# Patient Record
Sex: Male | Born: 1960 | Race: Black or African American | Hispanic: No | Marital: Single | Smoking: Current some day smoker
Health system: Southern US, Community
[De-identification: ages and names within clinical notes are randomized; demographics above are authoritative.]

## PROBLEM LIST (undated history)

## (undated) DIAGNOSIS — K759 Inflammatory liver disease, unspecified: Secondary | ICD-10-CM

## (undated) DIAGNOSIS — F329 Major depressive disorder, single episode, unspecified: Secondary | ICD-10-CM

## (undated) DIAGNOSIS — F419 Anxiety disorder, unspecified: Secondary | ICD-10-CM

## (undated) DIAGNOSIS — J449 Chronic obstructive pulmonary disease, unspecified: Secondary | ICD-10-CM

## (undated) DIAGNOSIS — J45909 Unspecified asthma, uncomplicated: Secondary | ICD-10-CM

## (undated) DIAGNOSIS — R569 Unspecified convulsions: Secondary | ICD-10-CM

## (undated) DIAGNOSIS — M75101 Unspecified rotator cuff tear or rupture of right shoulder, not specified as traumatic: Secondary | ICD-10-CM

## (undated) DIAGNOSIS — F32A Depression, unspecified: Secondary | ICD-10-CM

---

## 2014-08-28 ENCOUNTER — Emergency Department (HOSPITAL_COMMUNITY)
Admission: EM | Admit: 2014-08-28 | Discharge: 2014-08-29 | Disposition: A | Discharge status: Home / Self Care | Payer: Medicaid Other | Attending: Emergency Medicine | Admitting: Emergency Medicine

## 2014-08-28 DIAGNOSIS — Y929 Unspecified place or not applicable: Secondary | ICD-10-CM | POA: Insufficient documentation

## 2014-08-28 DIAGNOSIS — W3400XA Accidental discharge from unspecified firearms or gun, initial encounter: Secondary | ICD-10-CM

## 2014-08-28 DIAGNOSIS — S21102A Unspecified open wound of left front wall of thorax without penetration into thoracic cavity, initial encounter: Secondary | ICD-10-CM | POA: Insufficient documentation

## 2014-08-28 DIAGNOSIS — S61401A Unspecified open wound of right hand, initial encounter: Secondary | ICD-10-CM | POA: Diagnosis not present

## 2014-08-28 DIAGNOSIS — T07XXXA Unspecified multiple injuries, initial encounter: Secondary | ICD-10-CM

## 2014-08-28 DIAGNOSIS — S31109A Unspecified open wound of abdominal wall, unspecified quadrant without penetration into peritoneal cavity, initial encounter: Secondary | ICD-10-CM | POA: Insufficient documentation

## 2014-08-28 DIAGNOSIS — S0183XA Puncture wound without foreign body of other part of head, initial encounter: Secondary | ICD-10-CM | POA: Insufficient documentation

## 2014-08-28 DIAGNOSIS — X58XXXA Exposure to other specified factors, initial encounter: Secondary | ICD-10-CM | POA: Diagnosis not present

## 2014-08-28 DIAGNOSIS — K047 Periapical abscess without sinus: Secondary | ICD-10-CM | POA: Insufficient documentation

## 2014-08-28 DIAGNOSIS — S0091XA Abrasion of unspecified part of head, initial encounter: Secondary | ICD-10-CM | POA: Diagnosis not present

## 2014-08-28 DIAGNOSIS — S61502A Unspecified open wound of left wrist, initial encounter: Secondary | ICD-10-CM | POA: Insufficient documentation

## 2014-08-28 DIAGNOSIS — Y9301 Activity, walking, marching and hiking: Secondary | ICD-10-CM | POA: Diagnosis not present

## 2014-08-28 DIAGNOSIS — S71102A Unspecified open wound, left thigh, initial encounter: Secondary | ICD-10-CM | POA: Diagnosis present

## 2014-08-28 DIAGNOSIS — S61402A Unspecified open wound of left hand, initial encounter: Secondary | ICD-10-CM | POA: Insufficient documentation

## 2014-08-28 DIAGNOSIS — R000 Tachycardia, unspecified: Secondary | ICD-10-CM | POA: Insufficient documentation

## 2014-08-28 DIAGNOSIS — Y998 Other external cause status: Secondary | ICD-10-CM | POA: Insufficient documentation

## 2014-08-28 HISTORY — DX: Unspecified asthma, uncomplicated: J45.909
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
## 2014-08-29 ENCOUNTER — Emergency Department (HOSPITAL_COMMUNITY): Payer: Medicaid Other

## 2014-08-29 ENCOUNTER — Encounter (HOSPITAL_COMMUNITY): Payer: Self-pay | Admitting: Radiology

## 2014-08-29 DIAGNOSIS — S71102A Unspecified open wound, left thigh, initial encounter: Secondary | ICD-10-CM | POA: Diagnosis not present

## 2014-08-29 LAB — TYPE AND SCREEN
ABO/RH(D): A POS
Antibody Screen: NEGATIVE
Unit division: 0
Unit division: 0

## 2014-08-29 LAB — CBC
HCT: 39.9 % (ref 39.0–52.0)
Hemoglobin: 14.3 g/dL (ref 13.0–17.0)
MCH: 31.6 pg (ref 26.0–34.0)
MCHC: 35.8 g/dL (ref 30.0–36.0)
MCV: 88.1 fL (ref 78.0–100.0)
PLATELETS: 166 10*3/uL (ref 150–400)
RBC: 4.53 MIL/uL (ref 4.22–5.81)
RDW: 14.5 % (ref 11.5–15.5)
WBC: 8.6 10*3/uL (ref 4.0–10.5)

## 2014-08-29 LAB — CDS SEROLOGY

## 2014-08-29 LAB — COMPREHENSIVE METABOLIC PANEL
ALT: 53 U/L (ref 0–53)
AST: 62 U/L — ABNORMAL HIGH (ref 0–37)
Albumin: 4.1 g/dL (ref 3.5–5.2)
Alkaline Phosphatase: 51 U/L (ref 39–117)
Anion gap: 12 (ref 5–15)
BUN: 12 mg/dL (ref 6–23)
CO2: 24 mmol/L (ref 19–32)
Calcium: 9.4 mg/dL (ref 8.4–10.5)
Chloride: 106 mmol/L (ref 96–112)
Creatinine, Ser: 1.36 mg/dL — ABNORMAL HIGH (ref 0.50–1.35)
GFR calc non Af Amer: 58 mL/min — ABNORMAL LOW (ref >90)
GFR, EST AFRICAN AMERICAN: 67 mL/min — AB (ref >90)
Glucose, Bld: 106 mg/dL — ABNORMAL HIGH (ref 70–99)
POTASSIUM: 4.3 mmol/L (ref 3.5–5.1)
Sodium: 142 mmol/L (ref 135–145)
TOTAL PROTEIN: 7.6 g/dL (ref 6.0–8.3)
Total Bilirubin: 0.6 mg/dL (ref 0.3–1.2)

## 2014-08-29 LAB — PREPARE FRESH FROZEN PLASMA
UNIT DIVISION: 0
Unit division: 0

## 2014-08-29 LAB — ABO/RH: ABO/RH(D): A POS

## 2014-08-29 LAB — ETHANOL: Alcohol, Ethyl (B): 59 mg/dL — ABNORMAL HIGH (ref 0–9)

## 2014-08-29 LAB — PROTIME-INR
INR: 1.09 (ref 0.00–1.49)
Prothrombin Time: 14.2 seconds (ref 11.6–15.2)

## 2014-08-29 MED ORDER — IOHEXOL 300 MG/ML  SOLN
100.0000 mL | Freq: Once | INTRAMUSCULAR | Status: AC | PRN
  Administered 2014-08-29: 100 mL via INTRAVENOUS

## 2014-08-29 MED ORDER — HYDROCODONE-ACETAMINOPHEN 5-325 MG PO TABS: 2.0000 | ORAL_TABLET | ORAL | Status: DC | PRN

## 2014-08-29 MED ORDER — CEFAZOLIN SODIUM 1-5 GM-% IV SOLN
1.0000 g | Freq: Once | INTRAVENOUS | Status: AC
  Administered 2014-08-29: 1 g via INTRAVENOUS
  Filled 2014-08-29: qty 50

## 2014-08-29 MED ORDER — CEPHALEXIN 250 MG PO CAPS: 250.0000 mg | ORAL_CAPSULE | Freq: Four times a day (QID) | ORAL | Status: DC

## 2014-08-29 NOTE — ED Notes (Signed)
[employer] at bedside 
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
## 2014-08-29 NOTE — ED Notes (Signed)
Pt remains in radiology 
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
## 2014-08-29 NOTE — ED Notes (Signed)
Reviewed discharge instructions and prescriptions  Voiced understanding

## 2014-08-29 NOTE — ED Notes (Signed)
Pt transported to CT and XR
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
## 2014-08-29 NOTE — ED Notes (Signed)
Patient reports he has consumed ETOH and marijuana this evening.
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
## 2014-09-23 ENCOUNTER — Other Ambulatory Visit: Payer: Medicaid Other

## 2014-09-23 DIAGNOSIS — B182 Chronic viral hepatitis C: Secondary | ICD-10-CM

## 2014-09-23 LAB — IRON: Iron: 57 ug/dL (ref 42–165)

## 2014-09-24 LAB — HEPATITIS B SURFACE ANTIGEN: HEP B S AG: NEGATIVE

## 2014-09-24 LAB — HIV ANTIBODY (ROUTINE TESTING W REFLEX): HIV 1&2 Ab, 4th Generation: NONREACTIVE

## 2014-09-24 LAB — HEPATITIS A ANTIBODY, TOTAL: Hep A Total Ab: REACTIVE — AB

## 2014-09-24 LAB — HEPATITIS B CORE ANTIBODY, TOTAL: HEP B C TOTAL AB: REACTIVE — AB

## 2014-09-24 LAB — HEPATITIS B SURFACE ANTIBODY,QUALITATIVE: Hep B S Ab: NEGATIVE

## 2014-09-26 LAB — ANA: ANA: NEGATIVE

## 2014-09-26 LAB — HEPATITIS C RNA QUANTITATIVE
HCV QUANT LOG: 6.64 {Log} — AB (ref <1.18)
HCV Quantitative: 4339079 IU/mL — ABNORMAL HIGH (ref <15)

## 2014-09-28 LAB — HEPATITIS C GENOTYPE

## 2014-11-02 ENCOUNTER — Ambulatory Visit (INDEPENDENT_AMBULATORY_CARE_PROVIDER_SITE_OTHER): Payer: Medicaid Other | Admitting: Internal Medicine

## 2014-11-02 ENCOUNTER — Encounter: Payer: Self-pay | Admitting: Internal Medicine

## 2014-11-02 VITALS — BP 127/85 | HR 80 | Temp 98.2°F | Ht 72.0 in | Wt 254.0 lb

## 2014-11-02 DIAGNOSIS — B182 Chronic viral hepatitis C: Secondary | ICD-10-CM | POA: Diagnosis not present

## 2014-11-02 MED ORDER — RIBAVIRIN 200 MG PO TABS: 1200.0000 mg/d | ORAL_TABLET | Freq: Two times a day (BID) | ORAL | Status: DC

## 2014-11-02 MED ORDER — OMBITAS-PARITAPRE-RITONA-DASAB 12.5-75-50 &250 MG PO TBPK: 1.0000 | ORAL_TABLET | Freq: Two times a day (BID) | ORAL | Status: DC
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
## 2014-11-02 NOTE — Patient Instructions (Signed)
Date 11/02/2014  Dear Victor Rowe, As discussed in the [facility], your hepatitis C therapy will include the following medications:          Harvoni 90mg /400mg  tablet:           Take 1 tablet by mouth once daily   Please note that ALL MEDICATIONS WILL START ON THE SAME DATE for a total of 12 weeks. ---------------------------------------------------------------- Your HCV Treatment Start Date: TBA   Your HCV genotype:  1a    Liver Fibrosis: TBD    ---------------------------------------------------------------- YOUR PHARMACY CONTACT:   [place] [facility] [place] Phone: [phone] Hours: Monday to Friday 7:30 am to 6:00 pm   Please always contact your pharmacy at least 3-4 business days before you run out of medications to ensure your next month's medication is ready or 1 week prior to running out if you receive it by mail.  Remember, each prescription is for 28 days. ---------------------------------------------------------------- GENERAL NOTES REGARDING YOUR HEPATITIS C MEDICATION:  VIEKIRA PAK contains 2 different types of tablets. You must take both types of tablets exactly as prescribed, to treat your chronic hepatitis C virus (HCV) infection. . the pink tablet contains: the medicines ombitasvir, paritaprevir, and ritonavir . the beige tablet contains: the medicine dasabuvir  How should I take VIEKIRA PAK? Marland Kitchen When you receive your VIEKIRA PAK prescription, you will get a monthly carton that contains enough medicine for 28 days. . Each monthly carton of VIEKIRA PAK contains 4 smaller cartons.  . Each of the 4 smaller cartons contains enough child resistant daily dose packs of medicine to last for 7 days (1 week). . Each daily dose pack contains all of your VIEKIRA PAK medicine for 1 day (4 tablets). Follow the instructions on each daily dose pack about how to remove the tablets. . Take VIEKIRA PAK tablets with  a meal as follows: ? take the 2 pink tablets (ombitasvir, paritaprevir, and ritonavir), with 1 of the beige tablets (dasabuvir), at about the same time every morning. ? take the second beige tablet (dasabuvir), at about the same time every evening. . If you miss a dose of the pink tablets, and it is less than 12 hours from the time you usually take your dose, take the missed dose with a meal as soon as possible. Then take your next dose at your usual time with a meal. . If you miss a dose of the pink tablets, and it is more than 12 hours from the time you usually take your dose, do not take the missed dose. Take your next dose at your usual time with a meal. . If you miss a dose of the beige tablet, and it is less than 6 hours from the time you usually take your dose, take the missed dose with a meal as soon as possible. Then take your next dose at your usual time with a meal. . If you miss a dose of the beige tablet, and it is more than 6 hours from the time you usually take your dose, do not take the missed dose. Take your next dose at your usual time with a meal. . Do not take more than your prescribed dose of VIEKIRA PAK to make up for a missed dose. . If you take too much VIEKIRA PAK, cal  Ribavirin is an additional medication required and is taken twice a day. -3 capsules in the morning -3 capsules in the evening  Common side effects: 1. Nausea 2. Itching 3. Insomnia 4. Fatigue  Do not take with the following drugs: alfuzosin HCL; colchicine; carbamazepine, phenytoin, phenobarbital; gemfibrozil; rifampin; ergotamine, dihydroergotamine, ergonovine, methylergonovine; ethinyl estradiol-containing medicines, such as combined oral contraceptives; St. John's Wort (Hypericum perforatum); lovastatin, simvastatin; pimozide; efavirenz; sildenafil (when dosed as Revatio* for pulmonary arterial hypertension); triazolam and oral midazolam.  Call [phone]) for financial  assistance in certain cases. ---------------------------------------------------------------- GENERAL HELPFUL HINTS ON HCV THERAPY: 1. No alcohol. 2. Protect against sun-sensitivity/sunburns (wear sunglasses, hat, long sleeves, pants and sunscreen). 3. Stay well-hydrated/well-moisturized. 4. Notify the [facility] of any changes in your other over-the-counter/herbal or prescription medications. 5. If you miss a dose of your medication, take the missed dose as soon as you remember. Return to your regular time/dose schedule the next day.  6.  Do not stop taking your medications without first talking with your healthcare provider. 7.  You may take Tylenol (acetaminophen), as long as the dose is less than 2000 mg (OR no more than 4 tablets of the Tylenol Extra Strengths 500mg  tablet) in 24 hours. 8.  You will need to obtain routine labs and/or office visits at [facility] at weeks 2, 4, 8 and 12 as well as 12 weeks after completion of treatment.   Scharlene Gloss, [place] for [place] [address] [place], [place]  [address] [phone]

## 2014-11-15 ENCOUNTER — Ambulatory Visit (HOSPITAL_COMMUNITY): Payer: Medicaid Other

## 2014-11-17 ENCOUNTER — Ambulatory Visit (HOSPITAL_COMMUNITY): Payer: Medicaid Other

## 2014-11-24 ENCOUNTER — Ambulatory Visit: Payer: Medicaid Other | Admitting: Internal Medicine

## 2014-12-14 ENCOUNTER — Ambulatory Visit (HOSPITAL_COMMUNITY)
Admission: RE | Admit: 2014-12-14 | Discharge: 2014-12-14 | Disposition: A | Discharge status: Home / Self Care | Payer: Medicaid Other | Source: Ambulatory Visit | Attending: Internal Medicine | Admitting: Internal Medicine

## 2014-12-14 DIAGNOSIS — B182 Chronic viral hepatitis C: Secondary | ICD-10-CM

## 2014-12-21 ENCOUNTER — Telehealth: Payer: Self-pay | Admitting: Pharmacist Clinician (PhC)/ Clinical Pharmacy Specialist

## 2014-12-21 NOTE — Telephone Encounter (Signed)
Dervin is now approved for [employer]. I spoke to his significant other today about how to take it and explained that he needs to come back here in 1 month for labs so we can do the extension.

## 2014-12-22 NOTE — Telephone Encounter (Signed)
Patient to start 7/8.  RN rescheduled from 7/12 to 8/9.

## 2014-12-27 ENCOUNTER — Encounter: Payer: Medicaid Other | Admitting: Internal Medicine

## 2015-01-04 NOTE — Progress Notes (Signed)
This encounter was created in error - please disregard.

## 2015-01-16 ENCOUNTER — Other Ambulatory Visit: Payer: Medicaid Other

## 2015-01-16 DIAGNOSIS — B182 Chronic viral hepatitis C: Secondary | ICD-10-CM

## 2015-01-16 LAB — CBC WITH DIFFERENTIAL/PLATELET
BASOS ABS: 0 10*3/uL (ref 0.0–0.1)
BASOS PCT: 0 % (ref 0–1)
EOS ABS: 0.1 10*3/uL (ref 0.0–0.7)
Eosinophils Relative: 1 % (ref 0–5)
HCT: 39.3 % (ref 39.0–52.0)
HEMOGLOBIN: 13 g/dL (ref 13.0–17.0)
LYMPHS PCT: 30 % (ref 12–46)
Lymphs Abs: 2.2 10*3/uL (ref 0.7–4.0)
MCH: 31.2 pg (ref 26.0–34.0)
MCHC: 33.1 g/dL (ref 30.0–36.0)
MCV: 94.2 fL (ref 78.0–100.0)
MPV: 9.7 fL (ref 8.6–12.4)
Monocytes Absolute: 0.4 10*3/uL (ref 0.1–1.0)
Monocytes Relative: 5 % (ref 3–12)
NEUTROS PCT: 64 % (ref 43–77)
Neutro Abs: 4.7 10*3/uL (ref 1.7–7.7)
PLATELETS: 233 10*3/uL (ref 150–400)
RBC: 4.17 MIL/uL — ABNORMAL LOW (ref 4.22–5.81)
RDW: 16.2 % — AB (ref 11.5–15.5)
WBC: 7.4 10*3/uL (ref 4.0–10.5)

## 2015-01-16 NOTE — Addendum Note (Signed)
Addended by: Landis Gandy on: 01/16/2015 02:09 PM   Modules accepted: Orders

## 2015-01-18 LAB — HEPATITIS C RNA QUANTITATIVE: HCV Quantitative: NOT DETECTED IU/mL (ref <15)

## 2015-01-24 ENCOUNTER — Encounter: Payer: Self-pay | Admitting: Internal Medicine

## 2015-01-24 ENCOUNTER — Ambulatory Visit (INDEPENDENT_AMBULATORY_CARE_PROVIDER_SITE_OTHER): Payer: Medicaid Other | Admitting: Internal Medicine

## 2015-01-24 VITALS — BP 130/86 | HR 61 | Temp 98.2°F | Ht 72.25 in | Wt 248.0 lb

## 2015-01-24 DIAGNOSIS — F1010 Alcohol abuse, uncomplicated: Secondary | ICD-10-CM | POA: Diagnosis not present

## 2015-01-24 DIAGNOSIS — B182 Chronic viral hepatitis C: Secondary | ICD-10-CM | POA: Diagnosis present

## 2015-01-24 DIAGNOSIS — K7460 Unspecified cirrhosis of liver: Secondary | ICD-10-CM

## 2015-01-24 DIAGNOSIS — Z720 Tobacco use: Secondary | ICD-10-CM

## 2015-01-24 NOTE — Assessment & Plan Note (Signed)
Discussed cessation and encourage him to stop

## 2015-01-24 NOTE — Assessment & Plan Note (Signed)
Feels well, went over his labs and pleased with results.  No issues with medicaiton.

## 2015-01-24 NOTE — Progress Notes (Signed)
   Subjective:    Patient ID: Victor Rowe, male    DOB: [birth year], 54 y.o.   MRN: [id number]  HPI Here for follow up of HCV.  Genotype 1a, viral load of 4.3 million, elastography with F3/4.  Started Victor Rowe with weight based ribavirin started 7/8.  No issues, denies missed doses.  Labs reviewed with him and now undetectable.  Hgb down from 14.3 to 13.  No fatigue, no headache.  Stopped drinking completely.  Hepatitis A and B immune.     Review of Systems  Constitutional: Negative for fatigue.  Gastrointestinal: Negative for nausea and diarrhea.  Skin: Negative for rash.  Neurological: Negative for dizziness, light-headedness and headaches.   SHx: has now been alcohol free since starting treatment.  History of daily drinking.  + tobacco with 1/2 pk per day.  No drug use.   Past Medical History  Diagnosis Date  . Asthma         Objective:   Physical Exam  Constitutional: He appears well-developed and well-nourished. No distress.  HENT:  Mouth/Throat: No oropharyngeal exudate.  Eyes: No scleral icterus.  Cardiovascular: Normal rate, regular rhythm and normal heart sounds.   No murmur heard. Pulmonary/Chest: Effort normal and breath sounds normal. No respiratory distress. He has no wheezes.  Lymphadenopathy:    He has no cervical adenopathy.  Skin: No rash noted.          Assessment & Plan:

## 2015-01-24 NOTE — Assessment & Plan Note (Signed)
Went over results of elastography.  Concern for cirrhosis.  Will need EGD for screening varices. Will need to be referred by PCP due to insurance and will notify

## 2015-01-24 NOTE — Assessment & Plan Note (Signed)
Congratulated him on no drinking

## 2015-03-21 ENCOUNTER — Other Ambulatory Visit: Payer: Medicaid Other

## 2015-03-21 DIAGNOSIS — B182 Chronic viral hepatitis C: Secondary | ICD-10-CM

## 2015-03-21 LAB — CBC WITH DIFFERENTIAL/PLATELET
Basophils Absolute: 0.1 10*3/uL (ref 0.0–0.1)
Basophils Relative: 1 % (ref 0–1)
EOS ABS: 0.1 10*3/uL (ref 0.0–0.7)
Eosinophils Relative: 1 % (ref 0–5)
HCT: 35.5 % — ABNORMAL LOW (ref 39.0–52.0)
HEMOGLOBIN: 11 g/dL — AB (ref 13.0–17.0)
Lymphocytes Relative: 34 % (ref 12–46)
Lymphs Abs: 2.1 10*3/uL (ref 0.7–4.0)
MCH: 31.2 pg (ref 26.0–34.0)
MCHC: 31 g/dL (ref 30.0–36.0)
MCV: 100.6 fL — ABNORMAL HIGH (ref 78.0–100.0)
MPV: 9.7 fL (ref 8.6–12.4)
Monocytes Absolute: 0.3 10*3/uL (ref 0.1–1.0)
Monocytes Relative: 4 % (ref 3–12)
NEUTROS ABS: 3.8 10*3/uL (ref 1.7–7.7)
NEUTROS PCT: 60 % (ref 43–77)
PLATELETS: 191 10*3/uL (ref 150–400)
RBC: 3.53 MIL/uL — AB (ref 4.22–5.81)
RDW: 13 % (ref 11.5–15.5)
WBC: 6.3 10*3/uL (ref 4.0–10.5)

## 2015-03-21 LAB — COMPLETE METABOLIC PANEL WITH GFR
ALBUMIN: 4 g/dL (ref 3.6–5.1)
ALK PHOS: 56 U/L (ref 40–115)
ALT: 20 U/L (ref 9–46)
AST: 24 U/L (ref 10–35)
BUN: 7 mg/dL (ref 7–25)
CO2: 28 mmol/L (ref 20–31)
Calcium: 9.4 mg/dL (ref 8.6–10.3)
Chloride: 107 mmol/L (ref 98–110)
Creat: 1.05 mg/dL (ref 0.70–1.33)
GFR, Est African American: >89 mL/min (ref >=60)
GFR, Est Non African American: 80 mL/min (ref >=60)
GLUCOSE: 79 mg/dL (ref 65–99)
Potassium: 4.6 mmol/L (ref 3.5–5.3)
Sodium: 140 mmol/L (ref 135–146)
Total Bilirubin: 0.6 mg/dL (ref 0.2–1.2)
Total Protein: 6.7 g/dL (ref 6.1–8.1)

## 2015-03-22 LAB — HEPATITIS C RNA QUANTITATIVE: HCV Quantitative: NOT DETECTED IU/mL (ref <15)

## 2015-03-28 ENCOUNTER — Emergency Department (HOSPITAL_COMMUNITY)
Admission: EM | Admit: 2015-03-28 | Discharge: 2015-03-29 | Disposition: A | Discharge status: Other Acute Inpatient Hospital | Payer: Medicaid Other | Attending: Emergency Medicine | Admitting: Emergency Medicine

## 2015-03-28 ENCOUNTER — Encounter (HOSPITAL_COMMUNITY): Payer: Self-pay | Admitting: Emergency Medicine

## 2015-03-28 DIAGNOSIS — J45909 Unspecified asthma, uncomplicated: Secondary | ICD-10-CM | POA: Diagnosis not present

## 2015-03-28 DIAGNOSIS — R443 Hallucinations, unspecified: Secondary | ICD-10-CM | POA: Insufficient documentation

## 2015-03-28 DIAGNOSIS — F1210 Cannabis abuse, uncomplicated: Secondary | ICD-10-CM | POA: Insufficient documentation

## 2015-03-28 DIAGNOSIS — R45851 Suicidal ideations: Secondary | ICD-10-CM

## 2015-03-28 DIAGNOSIS — Z720 Tobacco use: Secondary | ICD-10-CM | POA: Diagnosis not present

## 2015-03-28 DIAGNOSIS — F1310 Sedative, hypnotic or anxiolytic abuse, uncomplicated: Secondary | ICD-10-CM | POA: Diagnosis not present

## 2015-03-28 DIAGNOSIS — Z79899 Other long term (current) drug therapy: Secondary | ICD-10-CM | POA: Diagnosis not present

## 2015-03-28 DIAGNOSIS — R45850 Homicidal ideations: Secondary | ICD-10-CM

## 2015-03-28 LAB — CBC
HEMATOCRIT: 40.6 % (ref 39.0–52.0)
HEMOGLOBIN: 13.1 g/dL (ref 13.0–17.0)
MCH: 32.1 pg (ref 26.0–34.0)
MCHC: 32.3 g/dL (ref 30.0–36.0)
MCV: 99.5 fL (ref 78.0–100.0)
Platelets: 194 10*3/uL (ref 150–400)
RBC: 4.08 MIL/uL — ABNORMAL LOW (ref 4.22–5.81)
RDW: 12.2 % (ref 11.5–15.5)
WBC: 4.3 10*3/uL (ref 4.0–10.5)

## 2015-03-28 LAB — BASIC METABOLIC PANEL
Anion gap: 6 (ref 5–15)
BUN: 7 mg/dL (ref 6–20)
CALCIUM: 9.3 mg/dL (ref 8.9–10.3)
CHLORIDE: 106 mmol/L (ref 101–111)
CO2: 27 mmol/L (ref 22–32)
Creatinine, Ser: 0.9 mg/dL (ref 0.61–1.24)
GFR calc Af Amer: >60 mL/min (ref >60)
GFR calc non Af Amer: >60 mL/min (ref >60)
GLUCOSE: 100 mg/dL — AB (ref 65–99)
Potassium: 3.8 mmol/L (ref 3.5–5.1)
Sodium: 139 mmol/L (ref 135–145)

## 2015-03-28 LAB — RAPID URINE DRUG SCREEN, HOSP PERFORMED
AMPHETAMINES: NOT DETECTED
Barbiturates: NOT DETECTED
Benzodiazepines: POSITIVE — AB
COCAINE: NOT DETECTED
Opiates: NOT DETECTED
Tetrahydrocannabinol: POSITIVE — AB

## 2015-03-28 LAB — ETHANOL: Alcohol, Ethyl (B): <5 mg/dL (ref <5)

## 2015-03-28 LAB — SALICYLATE LEVEL: Salicylate Lvl: <4 mg/dL (ref 2.8–30.0)

## 2015-03-28 LAB — ACETAMINOPHEN LEVEL

## 2015-03-28 MED ORDER — LORAZEPAM 1 MG PO TABS: 1.0000 mg | ORAL_TABLET | Freq: Three times a day (TID) | ORAL | Status: DC | PRN

## 2015-03-28 MED ORDER — LORAZEPAM 1 MG PO TABS
2.0000 mg | ORAL_TABLET | Freq: Once | ORAL | Status: AC
  Administered 2015-03-28: 2 mg via ORAL
  Filled 2015-03-28: qty 2

## 2015-03-28 MED ORDER — IBUPROFEN 200 MG PO TABS: 600.0000 mg | ORAL_TABLET | Freq: Three times a day (TID) | ORAL | Status: DC | PRN

## 2015-03-28 MED ORDER — ZIPRASIDONE MESYLATE 20 MG IM SOLR
10.0000 mg | Freq: Once | INTRAMUSCULAR | Status: AC
  Administered 2015-03-28: 10 mg via INTRAMUSCULAR
  Filled 2015-03-28: qty 20

## 2015-03-28 MED ORDER — ZIPRASIDONE MESYLATE 20 MG IM SOLR
10.0000 mg | Freq: Once | INTRAMUSCULAR | Status: AC
  Administered 2015-03-28: 10 mg via INTRAMUSCULAR

## 2015-03-28 MED ORDER — ZOLPIDEM TARTRATE 5 MG PO TABS: 5.0000 mg | ORAL_TABLET | Freq: Every evening | ORAL | Status: DC | PRN

## 2015-03-28 MED ORDER — STERILE WATER FOR INJECTION IJ SOLN
INTRAMUSCULAR | Status: AC
  Administered 2015-03-28: 2 mL
  Filled 2015-03-28: qty 10

## 2015-03-28 MED ORDER — ONDANSETRON HCL 4 MG PO TABS: 4.0000 mg | ORAL_TABLET | Freq: Three times a day (TID) | ORAL | Status: DC | PRN

## 2015-03-28 MED ORDER — ACETAMINOPHEN 325 MG PO TABS: 650.0000 mg | ORAL_TABLET | ORAL | Status: DC | PRN

## 2015-03-28 MED ORDER — NICOTINE 21 MG/24HR TD PT24: 21.0000 mg | MEDICATED_PATCH | Freq: Every day | TRANSDERMAL | Status: DC

## 2015-03-28 NOTE — ED Provider Notes (Signed)
CSN: [id number]     Arrival date & time 03/28/15  8099 History   First MD Initiated Contact with Patient 03/28/15 [phone]     Chief Complaint  Patient presents with  . Hallucinations  . Suicidal  . Homicidal     (Consider location/radiation/quality/duration/timing/severity/associated sxs/prior Treatment) Patient is a 54 y.o. male presenting with mental health disorder. The history is provided by the patient.  Mental Health Problem Presenting symptoms: hallucinations, homicidal ideas and suicidal thoughts   Patient accompanied by:  Event organiser and partner Degree of incapacity (severity):  Severe Onset quality:  Gradual Timing:  Constant Progression:  Unchanged Chronicity:  Recurrent Context: noncompliance (hasn't been on meds since 2009.)   Treatment compliance:  Untreated Relieved by:  Nothing Worsened by:  Nothing tried   Past Medical History  Diagnosis Date  . Asthma    History reviewed. No pertinent past surgical history. No family history on file. Social History  Substance Use Topics  . Smoking status: Current Every Day Smoker -- 0.50 packs/day    Types: Cigarettes  . Smokeless tobacco: Never Used     Comment: "future goal, not right now"  . Alcohol Use: No    Review of Systems  Constitutional: Negative for fever.  Respiratory: Negative for cough and shortness of breath.   Psychiatric/Behavioral: Positive for suicidal ideas, homicidal ideas and hallucinations.  All other systems reviewed and are negative.     Allergies  Review of patient's allergies indicates no known allergies.  Home Medications   Prior to Admission medications   Medication Sig Start Date End Date Taking? Authorizing Provider  albuterol (PROVENTIL HFA;VENTOLIN HFA) 108 (90 BASE) MCG/ACT inhaler Inhale 1 puff into the lungs every 6 (six) hours as needed for wheezing or shortness of breath.    Historical Provider, MD  [place]-Paritapre-Ritona-Dasab (VIEKIRA PAK) 12.5-75-50 &250 MG TBPK  Take 1 packet by mouth 2 (two) times daily. 11/02/14   Thayer Headings, MD  ribavirin (COPEGUS) 200 MG tablet Take 3 tablets (600 mg total) by mouth 2 (two) times daily. 11/02/14   Thayer Headings, MD   Pulse 88  Temp(Src) 98 F (36.7 C) (Oral)  Resp 17  SpO2 100% Physical Exam  Constitutional: He is oriented to person, place, and time. He appears well-developed and well-nourished. No distress.  HENT:  Head: Normocephalic and atraumatic.  Mouth/Throat: Oropharynx is clear and moist. No oropharyngeal exudate.  Eyes: EOM are normal. Pupils are equal, round, and reactive to light.  Neck: Normal range of motion. Neck supple.  Cardiovascular: Normal rate and regular rhythm.  Exam reveals no friction rub.   No murmur heard. Pulmonary/Chest: Effort normal and breath sounds normal. No respiratory distress. He has no wheezes. He has no rales.  Abdominal: Soft. He exhibits no distension. There is no tenderness. There is no rebound.  Musculoskeletal: Normal range of motion. He exhibits no edema.  Neurological: He is alert and oriented to person, place, and time.  Skin: No rash noted. He is not diaphoretic.  Psychiatric: His affect is angry. He is aggressive and actively hallucinating (resopnding to internal stimuli, looking around the room while I am talking to him). He expresses homicidal and suicidal ideation.  Nursing note and vitals reviewed.   ED Course  Procedures (including critical care time) Labs Review Labs Reviewed  CBC - Abnormal; Notable for the following:    RBC 4.08 (*)    All other components within normal limits  BASIC METABOLIC PANEL - Abnormal; Notable for the following:  Glucose, Bld 100 (*)    All other components within normal limits  ACETAMINOPHEN LEVEL - Abnormal; Notable for the following:    Acetaminophen (Tylenol), Serum <10 (*)    All other components within normal limits  ETHANOL  SALICYLATE LEVEL  URINE RAPID DRUG SCREEN, HOSP PERFORMED    Imaging  Review No results found. I have personally reviewed and evaluated these images and lab results as part of my medical decision-making.   EKG Interpretation None      MDM   Final diagnoses:  Suicidal ideation  Homicidal ideation  Hallucination    55 year old male brought in by police voluntarily. Here he is also responding to internal stimuli. He does respond yes when asked if he wanted to kill himself or others. Ellene Route is here reporting he's been telling her the voices have been telling him to kill himself and others. Here he appears very aggressive, and he is Chartered certified accountant repeatedly. Will give Geodon. IVC placed by me. Psych recommended inpatient placement.   Evelina Bucy, MD 03/28/15 [phone]

## 2015-03-28 NOTE — ED Notes (Signed)
Pt is sleeping.  Video monitoring and 15 minute checks in place.

## 2015-03-28 NOTE — ED Notes (Signed)
Patient denies SI, HI and AVH at this time. Patient is calm and cooperative at this time. Plan of care discussed with patient.  Encouragement and support provided and safety maintain. Q 15 min safety checks remain in place.

## 2015-03-28 NOTE — BH Assessment (Signed)
Writer informed TTS (Toyka) of the consult.  

## 2015-03-28 NOTE — BH Assessment (Signed)
Assessment Note  Victor Rowe is an 54 y.o. male. Writer attempted to complete a TTS assessment on this patient but he was not arousable. Writer obtained collateral information from [employer] and IVC.   Pt brought in by police voluntarily. Per ED notes, "Pt states "they are out to get me". When pt is asked "who is out to get you", pt responds "people". Pt states that he hears voices that tell him to kill himself and others. Pt states, "they tell me to get things like a knife, stick, pole, whatever to hurt others with".Pt adds that in past he took a knife and cut himself "through the white meat to the bone", pt states that was a long time. Pt's fiance states that she has been with him since 2009 and he has schizophrenia, but never been on medications. Pt "has never been like this with me". States that this morning she noticed he was banging on the ground and yelling".  IVC reads: "Patients fiance reports he is hearing voices telling him to kill himself and others. Patient confirmed with a head nod when asked. Patient also asked if voices are telling him to hurt others. He is responding to internal stimuliSoftware engineer discussed clinicals obtained from ED notes and IVC with Reginold Agent, NP and inpatient psychiatric treatment was recommended.    Diagnosis: Axis I Disorder Psychotic Disorder NOS   Past Medical History:  Past Medical History  Diagnosis Date  . Asthma     History reviewed. No pertinent past surgical history.  Family History: No family history on file.  Social History:  reports that he has been smoking Cigarettes.  He has been smoking about 0.50 packs per day. He has never used smokeless tobacco. He reports that he uses illicit drugs (Marijuana). He reports that he does not drink alcohol.  Additional Social History:  Alcohol / Drug Use Pain Medications: SEE MAR Prescriptions: SEE MAR Over the Counter: SEE MAR History of alcohol / drug use?: No history of alcohol / drug  abuse (BAL is negative ; UDS has not resulted as of 1120 @ 03/28/2015; unable to confirm substance use as patient is not able to be aroused during time of TTS  assessment)  CIWA: CIWA-Ar Pulse Rate: 88 COWS:    Allergies: No Known Allergies  Home Medications:  (Not in a hospital admission)  OB/GYN Status:  No LMP for male patient.  General Assessment Data Location of Assessment: [facility] TTS Assessment: In system Is this a Tele or Face-to-Face Assessment?: Face-to-Face Is this an Initial Assessment or a Re-assessment for this encounter?: Initial Assessment Marital status: Long term relationship [place] name:  (n/a) Is patient pregnant?: Yes Pregnancy Status: No Living Arrangements: Other (Comment) (lives with fiance ) Can pt return to current living arrangement?: No Admission Status: Voluntary Is patient capable of signing voluntary admission?: Yes Referral Source: Self/Family/Friend Insurance type:  (Medicaid )  Medical Screening Exam ([place]) Medical Exam completed: No  Crisis Care Plan Living Arrangements: Other (Comment) (lives with fiance ) Name of Psychiatrist:  (No psychiatrist ) Name of Therapist:  (No therapist )  Education Status Is patient currently in school?: No Current Grade:  (n/a) Highest grade of school patient has completed:  (n/a) Name of school:  (n/a) Contact person:  (n/a)  Risk to self with the past 6 months Suicidal Ideation:  Regulatory affairs officer unable to confirm or deny; patient sleeping heavily) Has patient been a risk to self within the past 6 months prior to  admission? :  (unk) Suicidal Intent:  (unk) Has patient had any suicidal intent within the past 6 months prior to admission? :  (unk) Is patient at risk for suicide?:  (unk) Suicidal Plan?:  (unk) Has patient had any suicidal plan within the past 6 months prior to admission? :  (unk) What has been your use of drugs/alcohol within the last 12 months?:  (unable to confirm or deny; BAL  negative; no UDS level in cha) Previous Attempts/Gestures:  (unk) How many times?:  (unk) Other Self Harm Risks:  (n/a) Triggers for Past Attempts: Unknown Intentional Self Injurious Behavior:  (unk) Family Suicide History: Unknown Recent stressful life event(s): Other (Comment) (unk; patient unable to disclose stressors; heavily sleeping) Persecutory voices/beliefs?:  (unk) Depression:  (unk; unable to confirm nor deny) Depression Symptoms:  (unk) Substance abuse history and/or treatment for substance abuse?:  (unk) Suicide prevention information given to non-admitted patients: Not applicable  Risk to Others within the past 6 months Homicidal Ideation:  Regulatory affairs officer unable to confirm or deny) Does patient have any lifetime risk of violence toward others beyond the six months prior to admission? : Unknown Thoughts of Harm to Others:  (unk) Current Homicidal Intent:  (unk) Current Homicidal Plan:  (unk) Access to Homicidal Means:  (unk) Identified Victim:  (unk) History of harm to others?:  (unk) Assessment of Violence:  (unk) Violent Behavior Description:  (patient is calm and cooperative ) Does patient have access to weapons?:  (unk) Criminal Charges Pending?:  (unk) Is patient on probation?: Unknown  Psychosis Hallucinations:  (unable to confirm or deny) Delusions: None noted, Unspecified (unable to confirm or deny)  Mental Status Report Appearance/Hygiene: In scrubs Eye Contact: Unable to Assess Motor Activity: Unable to assess Speech: Unable to assess Level of Consciousness: Unable to assess Mood:  (UTA) Affect: Unable to Assess Anxiety Level:  (UTA) Thought Processes: Unable to Assess Judgement: Unable to Assess Orientation: Unable to assess Obsessive Compulsive Thoughts/Behaviors: Unable to Assess  Cognitive Functioning Concentration: Unable to Assess Memory: Unable to Assess IQ: Average Insight: Unable to Assess Impulse Control: Unable to Assess Appetite:   (UTA) Weight Loss:  (unk) Weight Gain:  (unk) Sleep: Unable to Assess Total Hours of Sleep:  (unk) Vegetative Symptoms: Unable to Assess  ADLScreening [facility] Assessment Services) Patient's cognitive ability adequate to safely complete daily activities?: Yes Patient able to express need for assistance with ADLs?: Yes Independently performs ADLs?: Yes (appropriate for developmental age)  Prior Inpatient Therapy Prior Inpatient Therapy:  (Unable to confirm or deny) Prior Therapy Dates:  (unk) Prior Therapy Facilty/Provider(s):  (unk) Reason for Treatment:  (unk)  Prior Outpatient Therapy Prior Outpatient Therapy:  (Unable to confirm or deny) Prior Therapy Dates:  (unk) Prior Therapy Facilty/Provider(s):  (unk) Reason for Treatment:  (unk) Does patient have an ACCT team?: Unknown Does patient have Intensive In-House Services?  : Unknown Does patient have [facility] services? : Unknown Does patient have [facility] services?: Unknown  ADL Screening (condition at time of admission) Patient's cognitive ability adequate to safely complete daily activities?: Yes Is the patient deaf or have difficulty hearing?: No Does the patient have difficulty seeing, even when wearing glasses/contacts?: No Does the patient have difficulty concentrating, remembering, or making decisions?: Yes Patient able to express need for assistance with ADLs?: Yes Does the patient have difficulty dressing or bathing?: No Independently performs ADLs?: Yes (appropriate for developmental age) Does the patient have difficulty walking or climbing stairs?: No Weakness of Legs: None Weakness of Arms/Hands: None  Home Assistive Devices/Equipment Home Assistive Devices/Equipment: None    Abuse/Neglect Assessment (Assessment to be complete while patient is alone) Physical Abuse: Denies Verbal Abuse: Denies Sexual Abuse: Denies Exploitation of patient/patient's resources: Denies Self-Neglect: Denies Values / Beliefs Cultural  Requests During Hospitalization: None Spiritual Requests During Hospitalization: None   Advance Directives (For Healthcare) Does patient have an advance directive?: No Would patient like information on creating an advanced directive?: No - patient declined information    Additional Information 1:1 In Past 12 Months?: No CIRT Risk: No Elopement Risk: No Does patient have medical clearance?: Yes     Disposition:  Disposition Initial Assessment Completed for this Encounter: Yes Disposition of Patient: Inpatient treatment program Reginold Agent, NP recommends inpatient treatment) Type of inpatient treatment program: Adult  On Site Evaluation by:   Reviewed with Physician:    Waldon Merl [facility] 03/28/2015 12:02 PM

## 2015-03-28 NOTE — ED Notes (Signed)
Pt's 2 belonging bags are in locker #32 in [place].

## 2015-03-28 NOTE — ED Notes (Signed)
Pt brought in by police voluntarily. Pt states "they are out to get me".  When pt is asked "who is out to get you", pt responds "people".   Pt states that he hears voices that tell him to kill himself and others.  Pt states, "they tell me to get things like a knife, stick, pole, whatever to hurt others with".  Pt adds that in past he took a knife and cut himself "through the white meat to the bone", pt states that was a long time.   Pt's fiance states that she has been with him since 2009 and he has schizophrenia, but never been on medications.  Pt "has never been like this with me".  States that this morning she noticed he was banging on the ground and yelling.

## 2015-03-28 NOTE — BH Assessment (Addendum)
[facility] Assessment Progress Note  Per Donata Duff, NP, this pt requires psychiatric hospitalization at this time.  Letitia Libra, RN, [facility] has assigned pt to [facility] Rm 507-1.  Pt presents under IVC initiated by EDP Evelina Bucy, MD.  Pt has signed Consent to Release Information to his fiancee, Arrie Aran, and signed form has been faxed to [facility].  Pt's nurse, Nena Jordan, has been notified, and agrees to send original document along with pt via [employer], and to call report to [phone].  Jalene Mullet, [place] Triage Specialist [phone]

## 2015-03-28 NOTE — ED Notes (Signed)
Toyka with TTS came to evaluate patient.  Pt heavily sleeping.  Was asked to remove TTS consult until pt is more alert.

## 2015-03-29 ENCOUNTER — Inpatient Hospital Stay (HOSPITAL_COMMUNITY)
Admission: AD | Admit: 2015-03-29 | Discharge: 2015-04-01 | DRG: 885 | Disposition: A | Discharge status: Home / Self Care | Payer: Medicaid Other | Attending: Psychiatry | Admitting: Psychiatry

## 2015-03-29 ENCOUNTER — Encounter (HOSPITAL_COMMUNITY): Payer: Self-pay

## 2015-03-29 ENCOUNTER — Other Ambulatory Visit: Payer: Self-pay

## 2015-03-29 DIAGNOSIS — F1020 Alcohol dependence, uncomplicated: Secondary | ICD-10-CM | POA: Diagnosis present

## 2015-03-29 DIAGNOSIS — F17210 Nicotine dependence, cigarettes, uncomplicated: Secondary | ICD-10-CM | POA: Diagnosis present

## 2015-03-29 DIAGNOSIS — Z9114 Patient's other noncompliance with medication regimen: Secondary | ICD-10-CM | POA: Diagnosis not present

## 2015-03-29 DIAGNOSIS — F250 Schizoaffective disorder, bipolar type: Principal | ICD-10-CM | POA: Diagnosis present

## 2015-03-29 DIAGNOSIS — Z811 Family history of alcohol abuse and dependence: Secondary | ICD-10-CM | POA: Diagnosis not present

## 2015-03-29 DIAGNOSIS — F1210 Cannabis abuse, uncomplicated: Secondary | ICD-10-CM | POA: Clinically undetermined

## 2015-03-29 DIAGNOSIS — Z915 Personal history of self-harm: Secondary | ICD-10-CM | POA: Diagnosis not present

## 2015-03-29 DIAGNOSIS — F258 Other schizoaffective disorders: Secondary | ICD-10-CM

## 2015-03-29 DIAGNOSIS — F259 Schizoaffective disorder, unspecified: Secondary | ICD-10-CM | POA: Diagnosis present

## 2015-03-29 HISTORY — DX: Inflammatory liver disease, unspecified: K75.9

## 2015-03-29 HISTORY — DX: Anxiety disorder, unspecified: F41.9

## 2015-03-29 HISTORY — DX: Major depressive disorder, single episode, unspecified: F32.9

## 2015-03-29 HISTORY — DX: Depression, unspecified: F32.A

## 2015-03-29 LAB — GLUCOSE, CAPILLARY: GLUCOSE-CAPILLARY: 108 mg/dL — AB (ref 65–99)

## 2015-03-29 MED ORDER — BENZTROPINE MESYLATE 0.5 MG PO TABS
0.5000 mg | ORAL_TABLET | Freq: Every day | ORAL | Status: DC
  Administered 2015-03-29 – 2015-03-31 (×3): 0.5 mg via ORAL
  Filled 2015-03-29 (×5): qty 1

## 2015-03-29 MED ORDER — HYDROXYZINE HCL 25 MG PO TABS: 25.0000 mg | ORAL_TABLET | Freq: Three times a day (TID) | ORAL | Status: DC | PRN

## 2015-03-29 MED ORDER — LAMOTRIGINE 25 MG PO TABS
25.0000 mg | ORAL_TABLET | Freq: Every day | ORAL | Status: DC
  Administered 2015-03-29 – 2015-03-31 (×3): 25 mg via ORAL
  Filled 2015-03-29 (×5): qty 1

## 2015-03-29 MED ORDER — RISPERIDONE 2 MG PO TBDP: 2.0000 mg | ORAL_TABLET | Freq: Three times a day (TID) | ORAL | Status: DC | PRN

## 2015-03-29 MED ORDER — MAGNESIUM HYDROXIDE 400 MG/5ML PO SUSP: 30.0000 mL | Freq: Every day | ORAL | Status: DC | PRN

## 2015-03-29 MED ORDER — LORAZEPAM 1 MG PO TABS: 1.0000 mg | ORAL_TABLET | ORAL | Status: DC | PRN

## 2015-03-29 MED ORDER — TRAZODONE HCL 50 MG PO TABS
50.0000 mg | ORAL_TABLET | Freq: Every evening | ORAL | Status: DC | PRN
  Administered 2015-03-29: 50 mg via ORAL
  Filled 2015-03-29 (×4): qty 1

## 2015-03-29 MED ORDER — ZIPRASIDONE MESYLATE 20 MG IM SOLR: 20.0000 mg | INTRAMUSCULAR | Status: DC | PRN

## 2015-03-29 MED ORDER — ACETAMINOPHEN 325 MG PO TABS: 650.0000 mg | ORAL_TABLET | Freq: Four times a day (QID) | ORAL | Status: DC | PRN

## 2015-03-29 MED ORDER — NICOTINE 21 MG/24HR TD PT24
21.0000 mg | MEDICATED_PATCH | Freq: Every day | TRANSDERMAL | Status: DC
  Filled 2015-03-29 (×6): qty 1

## 2015-03-29 MED ORDER — ALUM & MAG HYDROXIDE-SIMETH 200-200-20 MG/5ML PO SUSP: 30.0000 mL | ORAL | Status: DC | PRN

## 2015-03-29 MED ORDER — ARIPIPRAZOLE 5 MG PO TABS
5.0000 mg | ORAL_TABLET | Freq: Every day | ORAL | Status: DC
  Administered 2015-03-29 – 2015-03-31 (×3): 5 mg via ORAL
  Filled 2015-03-29 (×5): qty 1

## 2015-03-29 NOTE — Tx Team (Addendum)
Interdisciplinary Treatment Plan Update (Adult)  Date: 03/29/2015  Time Reviewed: 9:00 AM   Progress in Treatment: Attending groups: No. Participating in groups:  No. Taking medication as prescribed:  Yes. Tolerating medication:  Yes. Family/Significant othe contact made:   Patient understands diagnosis:  Yes. Discussing patient identified problems/goals with staff:  Yes. Medical problems stabilized or resolved:  Yes. Denies suicidal/homicidal ideation: No. Admitted w SI and AH urging him to harm friend Issues/concerns per patient self-inventory: Yes Other:  New problem(s) identified:  lack of outpatient medications mgmt, AH, relational concerns/lack of support  Discharge Plan or Barriers: not current w medications, has outpatient provider, needs to be scheduled w MD  Reason for Continuation of Hospitalization: Aggression Homicidal ideation Suicidal ideation Other; describe auditory hallucinations  Comments:  Patient admitted IVC after fiancee reported bizarre behavior at home, concerned re safety for himself and others. Pt states he hears voices telling him to harm self and others, paranoia, socially isolated.  Has current therapy but has not been on medications for approx 10 years.  Diagnosed w schizophrenia approx high school.  Struggles w anger/irritability.    Estimated length of stay:  5 - 7 days  New goal(s): evaluation and initiation of medications to treat psychosis, anger management   Additional Comments:  Patient and CSW reviewed pt's identified goals and treatment plan. Patient verbalized understanding and agreed to treatment plan. CSW reviewed [facility] "Discharge Process and Patient Involvement" Form. Pt verbalized understanding of information provided and signed form.    Review of initial/current patient goals per problem list:    1.  Goal(s): Patient will participate in aftercare plan  Met:    Target date: at discharge  As evidenced by: Patient will  participate within aftercare plan AEB aftercare provider and housing plan at discharge being identified.  10/12:  Goal met, pt current w [facility], has meds mgmt and therapy appts.   2.  Goal (s): Patient will exhibit decreased depressive symptoms and suicidal ideations.  Met:     Target date: at discharge  As evidenced by: Patient will utilize self rating of depression at 3 or below and demonstrate decreased signs of depression or be deemed stable for discharge by MD.  10/12:  Currently endorses AH, "voices" telling him to harm himself and others, goal not met.   3.  Goal:  Patient will exhibit decreased signs/sx of psychosis  - Met:  - Target date:  At discharge  - As evidenced by:  Patient will display decreased signs/sx of psychosis or will return to baseline  - 10/12: Patient currently reports hearing voices, whispers, commands to harm self/others, goal not met.  Attendees: Patient:   03/29/2015 3:58 PM   Family:   03/29/2015 3:58 PM   Physician:  Dr. Hinda Kehr, MD 03/29/2015 3:58 PM   Nursing:   Darrol Angel RN, Gaylan Gerold RN 03/29/2015 3:58 PM   Clinical Social Worker: Edwyna Shell, LCSW 03/29/2015 3:58 PM   Clinical Social Worker: Ripley Fraise, LCSW 03/29/2015 3:58 PM   Other:  Gerline Legacy Nurse Case Manager 03/29/2015 3:58 PM   Other:  Lucinda Dell; [facility] T[facility]  03/29/2015 3:58 PM   Other:   03/29/2015 3:58 PM   Other:  03/29/2015 3:58 PM   Other:  03/29/2015 3:58 PM   Other:  03/29/2015 3:58 PM    03/29/2015 3:58 PM    03/29/2015 3:58 PM    03/29/2015 3:58 PM    03/29/2015 3:58 PM    Scribe for Treatment Team:  Edwyna Shell, LCSW 03/29/2015 3:58 PM

## 2015-03-29 NOTE — Progress Notes (Addendum)
Patient ID: [name], male   DOB: 1961-01-21, 54 y.o.   MRN: [id number] Pt is a 54 yo male presenting to the ED with SI/HI/AVH.  Pt's significant other reports pt has a hx of schizophrenia but has never taken medications for it.  Pt was hearing voices to kill self and others and pt reported on admission he sometimes sees people when they are not there.  Pt's significant other found pt 03/28/2015 am banging on the ground and yelling.  Pt has a hx of asthma and Hep C in which he states he "took a medication for 12 weeks"for this.  Pt reports a hx of alcohol abuse up until 12 weeks ago.  Pt stated he would drink approximately six 40 oz beers daily.  Pt also admits to [facility] use that he states he started 12 weeks ago when he stopped drinking.  Pt denied any prescription medication abuse, however his UDS was positive for benzos and THC. Pt admits to smoking cigarettes on a daily basis, smoking one pack over the course of two days.  Pt shared he lives with his significant other and is on disability.  Pt denied SI/HI/AVH on admission and contracts for safety.

## 2015-03-29 NOTE — [facility] Group Notes (Signed)
[facility] Mental Health Association Group Therapy  03/29/2015 , 3:48 PM    Type of Therapy:  Mental Health Association Presentation  Participation Level:  Active  Participation Quality:  Attentive  Affect:  Blunted  Cognitive:  Oriented  Insight:  Limited  Engagement in Therapy:  Engaged  Modes of Intervention:  Discussion, Education and Socialization  Summary of Progress/Problems:  Victor Rowe from [facility] came to present his recovery story and play the guitar.  [name] was in and out of group several times.  Out more than in.  Never really settled in.  [name] B 03/29/2015 , 3:48 PM

## 2015-03-29 NOTE — Progress Notes (Signed)
D: Patient is pleasant on approach.  Questions nurse about injection he received in ED.  Explained to him that he receive a geodon injection and he rested very well after that.  He also questioned how long he would have to stay here and seems worried about it.  He is positive for AVH. He denies any SI/HI.   A: Continue to monitor medication management and MD orders.  Safety checks completed every 15 minutes per protocol.  Offer support and encouragement as needed. R: Patient's behavior is appropriate.

## 2015-03-29 NOTE — ED Notes (Signed)
Pt transported to [facility] by [employer] for continuation of specialized care. Pt left in no acute distress. Belongings signed for and given to [employer] officer. Pt left in no acute distress. 
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
## 2015-03-29 NOTE — B[facility] Group Notes (Signed)
[facility] LCSW Aftercare Discharge Planning Group Note   03/29/2015 3:34 PM  Participation Quality:  Engaged  Mood/Affect:  Flat  Depression Rating:    Anxiety Rating:    Thoughts of Suicide:  Yes Will you contract for safety?   Yes  Current AVH:  Yes  Plan for Discharge/Comments:  States he has been getting increasingly aggravated.  Has been in and out of prison a couple of times.  Was prescribed thorazine in the past.  Has been with fiance in [place] for 7 years, but thinks this might be the end of the relationship.  Reason is unclear.  Says he gets disability for mental health and that his diagnosis is Schiz.  Is worried about his dog at home.   Transportation Means:   Supports:  [name] B
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
## 2015-03-29 NOTE — Tx Team (Addendum)
Initial Interdisciplinary Treatment Plan   PATIENT STRESSORS: Financial difficulties Health problems Substance abuse   PATIENT STRENGTHS: Ability for insight Active sense of humor Average or above average intelligence Capable of independent living Occupational psychologist fund of knowledge Motivation for treatment/growth Physical Health Religious Affiliation Special hobby/interest Supportive family/friends   PROBLEM LIST: Problem List/Patient Goals Date to be addressed Date deferred Reason deferred Estimated date of resolution  Anger management 03/29/2015     Depression 03/29/2015     "voices to kill myself" 03/31/15     Hx of asthma 03/31/15     Hep C 03/31/15                              DISCHARGE CRITERIA:  Ability to meet basic life and health needs Adequate post-discharge living arrangements Improved stabilization in mood, thinking, and/or behavior Medical problems require only outpatient monitoring Motivation to continue treatment in a less acute level of care Need for constant or close observation no longer present Reduction of life-threatening or endangering symptoms to within safe limits Safe-care adequate arrangements made Verbal commitment to aftercare and medication compliance Withdrawal symptoms are absent or subacute and managed without 24-hour nursing intervention  PRELIMINARY DISCHARGE PLAN: Outpatient therapy Return to previous living arrangement  PATIENT/FAMIILY INVOLVEMENT: This treatment plan has been presented to and reviewed with the patient, Victor Rowe, and/or family member.  The patient and family have been given the opportunity to ask questions and make suggestions.  Lincoln Brigham 03/29/2015, 4:15 AM

## 2015-03-29 NOTE — Progress Notes (Signed)
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 He denies SI/HI/AVH. He attended and participated in group. I asked him what his take away message tonight was from group. He states "As long as I keep making these meetings it will be a lot easier for me to cope outside.  A: Encouraged Raegan to continue to attend and participate in group to focus on better adaptive coping skills.  R: Continue to monitor for patient safety and medication effectiveness.

## 2015-03-29 NOTE — B[facility] Suicide Risk Assessment (Signed)
[facility] Admission Suicide Risk Assessment   Nursing information obtained from:  Patient Demographic factors:  Male, Divorced or widowed, Low socioeconomic status, Unemployed Current Mental Status:   (Pt denies SI/HI on admission) Loss Factors:  Decline in physical health, Financial problems / change in socioeconomic status Historical Factors:  Family history of mental illness or substance abuse, Impulsivity Risk Reduction Factors:  Sense of responsibility to family, Religious beliefs about death, Living with another person, especially a relative, Positive social support, Positive therapeutic relationship, Positive coping skills or problem solving skills Total Time spent with patient: 30 minutes Principal Problem: Schizo-affective schizophrenia, chronic condition with acute exacerbation ([place]) Diagnosis:   Patient Active Problem List   Diagnosis Date Noted  . Schizo-affective schizophrenia, chronic condition with acute exacerbation ([place]) [F25.8] 03/29/2015  . Alcohol use disorder, moderate, dependence ([place]) [F10.20] 03/29/2015  . Cannabis use disorder, mild, abuse [F12.10] 03/29/2015  . Hepatic cirrhosis ([place]) [K74.60] 01/24/2015  . Chronic hepatitis C without hepatic coma ([facility]) [B18.2] 11/02/2014     Continued Clinical Symptoms:  Alcohol Use Disorder Identification Test Final Score (AUDIT): 4 The "Alcohol Use Disorders Identification Test", Guidelines for Use in Primary Care, Second Edition.  World Pharmacologist [facility]). Score between 0-7:  no or low risk or alcohol related problems. Score between 8-15:  moderate risk of alcohol related problems. Score between 16-19:  high risk of alcohol related problems. Score 20 or above:  warrants further diagnostic evaluation for alcohol dependence and treatment.   CLINICAL FACTORS:   Alcohol/Substance Abuse/Dependencies Previous Psychiatric Diagnoses and Treatments   Musculoskeletal: Strength & Muscle Tone: within normal limits Gait &  Station: normal Patient leans: N/A  Psychiatric Specialty Exam: Physical Exam  ROS  Blood pressure 109/77, pulse 78, temperature 97.8 F (36.6 C), temperature source Oral, resp. rate 18, height 5' 10.5" (1.791 m), weight 100.699 kg (222 lb).Body mass index is 31.39 kg/(m^2).          Please see H&P.                                                COGNITIVE FEATURES THAT CONTRIBUTE TO RISK:  Closed-mindedness, Polarized thinking and Thought constriction (tunnel vision)    SUICIDE RISK:   Mild:  Suicidal ideation of limited frequency, intensity, duration, and specificity.  There are no identifiable plans, no associated intent, mild dysphoria and related symptoms, good self-control (both objective and subjective assessment), few other risk factors, and identifiable protective factors, including available and accessible social support.  PLAN OF CARE: Please see H&P.   Medical Decision Making:  Review of Psycho-Social Stressors (1), Review or order clinical lab tests (1), Decision to obtain old records (1), Established Problem, Worsening (2), Review of Last Therapy Session (1), Review of Medication Regimen & Side Effects (2) and Review of New Medication or Change in Dosage (2)  I certify that inpatient services furnished can reasonably be expected to improve the patient's condition.   Jay Kempe MD 03/29/2015, 12:11 PM

## 2015-03-30 LAB — LIPID PANEL
Cholesterol: 186 mg/dL (ref 0–200)
HDL: 54 mg/dL (ref >40)
LDL CALC: 113 mg/dL — AB (ref 0–99)
Total CHOL/HDL Ratio: 3.4 RATIO
Triglycerides: 93 mg/dL (ref <150)
VLDL: 19 mg/dL (ref 0–40)

## 2015-03-30 LAB — TSH: TSH: 1.17 u[IU]/mL (ref 0.350–4.500)

## 2015-03-30 LAB — GLUCOSE, CAPILLARY
GLUCOSE-CAPILLARY: 94 mg/dL (ref 65–99)
Glucose-Capillary: 100 mg/dL — ABNORMAL HIGH (ref 65–99)

## 2015-03-30 MED ORDER — TRAZODONE HCL 150 MG PO TABS
150.0000 mg | ORAL_TABLET | Freq: Every day | ORAL | Status: DC
Start: 2015-03-30 — End: 2015-04-01
  Administered 2015-03-30 – 2015-03-31 (×2): 150 mg via ORAL
  Filled 2015-03-30 (×4): qty 1

## 2015-03-30 NOTE — Progress Notes (Signed)
[facility] MD Progress Note  03/30/2015 12:49 PM Victor Rowe  MRN:  [id number] Subjective: Patient states " I did not sleep last night.'  Objective:Pt is a 54 yo AA male who has a hx of schizoaffective do , is on SSD, lives with his fiance , presented to the ED with SI/HI/AVH.Per initial notes in EHR " Pt's significant other reported that pt has a hx of schizophrenia but has never taken medications for it. Pt was hearing voices to kill self and others and pt reported on admission he sometimes sees people when they are not there. Pt's significant other found pt 03/28/2015 am banging on the ground and yelling. "   Patient seen and chart reviewed.Discussed patient with treatment team. Pt today seen as labile, anxious , but improved since yesterday. Pt continues to struggle with sleep issues , had a very restless night last night. Pt tolerating Trazodone well. Could increase the dose today. Pt denies SI today . Pt denies any withdrawal sx. Pt encouraged to attend group activities. Per staff - pt is labile , had a restless night, but denies any disruptive issues on the unit.    Principal Problem: Schizoaffective disorder, bipolar type ([place]) Diagnosis:   Patient Active Problem List   Diagnosis Date Noted  . Cannabis use disorder, mild, abuse [F12.10] 03/29/2015  . Schizoaffective disorder, bipolar type ([place]) [F25.0] 03/29/2015  . Alcohol use disorder, moderate, dependence ([place]) [F10.20] 03/29/2015  . Hepatic cirrhosis ([place]) [K74.60] 01/24/2015  . Chronic hepatitis C without hepatic coma ([place]) [B18.2] 11/02/2014   Total Time spent with patient: 30 minutes  Past Psychiatric History: Patient with hx of Bipolar disorder/Schizophrenia - diagnosed years ago while in [place]. Pt was hospitalized at [place] years ago , was on Thorazine in the past . Pt reports hx of suicide attempt x1 . Pt currently is noncompliant on his medications. States he tried to go to [facility] but stopped , since they  were too busy.   Past Medical History:  Past Medical History  Diagnosis Date  . Asthma   . Depression   . Anxiety   . Hepatitis     Hep C  Pt states he has taken a 12 week medication for disease   History reviewed. No pertinent past surgical history. Family History:  Family History  Problem Relation Age of Onset  . Alcoholism Father    Family Psychiatric  History: Father with hx of alcoholism. Denies mental illness.  Social History: Patient lives with fiance. Has 10 children who are grown. Pt is on SSD. Pt has filed for divorce from his ex wife who is in [place]. Pt has had several legal issues in the past - none pending at this time.  History  Alcohol Use No    Comment: Pt states he stopped drinking 12 weeks ago     History  Drug Use  . Yes  . Special: Marijuana    Social History   Social History  . Marital Status: Single    Spouse Name: N/A  . Number of Children: N/A  . Years of Education: N/A   Social History Main Topics  . Smoking status: Current Every Day Smoker -- 0.50 packs/day    Types: Cigarettes  . Smokeless tobacco: Never Used     Comment: "future goal, not right now"  . Alcohol Use: No     Comment: Pt states he stopped drinking 12 weeks ago  . Drug Use: Yes    Special: Marijuana  . Sexual  Activity: Yes   Other Topics Concern  . None   Social History Narrative   Additional Social History:    Pain Medications: Pt denies Prescriptions: Pt denies Over the Counter: Pt denies History of alcohol / drug use?: Yes Longest period of sobriety (when/how long): Pt states he has been sober for the past 12 weeks Negative Consequences of Use: Financial, Personal relationships                    Sleep: Poor  Appetite:  Fair  Current Medications: Current Facility-Administered Medications  Medication Dose Route Frequency Provider Last Rate Last Dose  . acetaminophen (TYLENOL) tablet 650 mg  650 mg Oral Q6H PRN Laverle Hobby, PA-C      . alum  & mag hydroxide-simeth (MAALOX/MYLANTA) 200-200-20 MG/5ML suspension 30 mL  30 mL Oral Q4H PRN Laverle Hobby, PA-C      . ARIPiprazole (ABILIFY) tablet 5 mg  5 mg Oral QHS Ursula Alert, MD   5 mg at 03/29/15 2232  . benztropine (COGENTIN) tablet 0.5 mg  0.5 mg Oral QHS Anaka Beazer, MD   0.5 mg at 03/29/15 2233  . hydrOXYzine (ATARAX/VISTARIL) tablet 25 mg  25 mg Oral TID PRN Laverle Hobby, PA-C      . lamoTRIgine (LAMICTAL) tablet 25 mg  25 mg Oral QHS Ursula Alert, MD   25 mg at 03/29/15 2233  . risperiDONE (RISPERDAL M-TABS) disintegrating tablet 2 mg  2 mg Oral Q8H PRN Laverle Hobby, PA-C       And  . LORazepam (ATIVAN) tablet 1 mg  1 mg Oral PRN Laverle Hobby, PA-C       And  . ziprasidone (GEODON) injection 20 mg  20 mg Intramuscular PRN Laverle Hobby, PA-C      . magnesium hydroxide (MILK OF MAGNESIA) suspension 30 mL  30 mL Oral Daily PRN Laverle Hobby, PA-C      . nicotine (NICODERM CQ - dosed in mg/24 hours) patch 21 mg  21 mg Transdermal Daily Ryver Poblete, MD   21 mg at 03/29/15 0800  . traZODone (DESYREL) tablet 150 mg  150 mg Oral QHS Ursula Alert, MD        Lab Results:  Results for orders placed or performed during the hospital encounter of 03/29/15 (from the past 48 hour(s))  Glucose, capillary     Status: Abnormal   Collection Time: 03/29/15  4:54 PM  Result Value Ref Range   Glucose-Capillary 108 (H) 65 - 99 mg/dL   Comment 1 Notify RN    Comment 2 Document in Chart   Glucose, capillary     Status: None   Collection Time: 03/29/15  9:08 PM  Result Value Ref Range   Glucose-Capillary 94 65 - 99 mg/dL  Glucose, capillary     Status: Abnormal   Collection Time: 03/30/15  6:15 AM  Result Value Ref Range   Glucose-Capillary 100 (H) 65 - 99 mg/dL  TSH     Status: None   Collection Time: 03/30/15  7:00 AM  Result Value Ref Range   TSH 1.170 0.350 - 4.500 uIU/mL    Comment: Performed at [facility]  Lipid panel     Status:  Abnormal   Collection Time: 03/30/15  7:00 AM  Result Value Ref Range   Cholesterol 186 0 - 200 mg/dL   Triglycerides 93 <150 mg/dL   HDL 54 >40 mg/dL   Total CHOL/HDL Ratio 3.4  RATIO   VLDL 19 0 - 40 mg/dL   LDL Cholesterol 113 (H) 0 - 99 mg/dL    Comment:        Total Cholesterol/HDL:CHD Risk Coronary Heart Disease Risk Table                     Men   Women  1/2 Average Risk   3.4   3.3  Average Risk       5.0   4.4  2 X Average Risk   9.6   7.1  3 X Average Risk  23.4   11.0        Use the calculated Patient Ratio above and the CHD Risk Table to determine the patient's CHD Risk.        ATP III CLASSIFICATION (LDL):  <100     mg/dL   Optimal  100-129  mg/dL   Near or Above                    Optimal  130-159  mg/dL   Borderline  160-189  mg/dL   High  >190     mg/dL   Very High Performed at [facility]     Physical Findings: AIMS: Facial and Oral Movements Muscles of Facial Expression: None, normal Lips and Perioral Area: None, normal Jaw: None, normal Tongue: None, normal,Extremity Movements Upper (arms, wrists, hands, fingers): None, normal Lower (legs, knees, ankles, toes): None, normal, Trunk Movements Neck, shoulders, hips: None, normal, Overall Severity Severity of abnormal movements (highest score from questions above): None, normal Incapacitation due to abnormal movements: None, normal Patient's awareness of abnormal movements (rate only patient's report): No Awareness, Dental Status Current problems with teeth and/or dentures?: Yes Does patient usually wear dentures?: Yes  CIWA:  CIWA-Ar Total: 0 COWS:  COWS Total Score: 0  Musculoskeletal: Strength & Muscle Tone: within normal limits Gait & Station: normal Patient leans: N/A  Psychiatric Specialty Exam: Review of Systems  Psychiatric/Behavioral: Positive for substance abuse. The patient is nervous/anxious and has insomnia.   All other systems reviewed and are negative.   Blood pressure  121/89, pulse 94, temperature 98.1 F (36.7 C), temperature source Oral, resp. rate 18, height 5' 10.5" (1.791 m), weight 100.699 kg (222 lb).Body mass index is 31.39 kg/(m^2).  General Appearance: Fairly Groomed  Engineer, water::  Fair  Speech:  Pressured  Volume:  Normal  Mood:  Anxious  Affect:  Labile  Thought Process:  Goal Directed  Orientation:  Full (Time, Place, and Person)  Thought Content:  Rumination  Suicidal Thoughts:  No  Homicidal Thoughts:  No  Memory:  Immediate;   Fair Recent;   Fair Remote;   Fair  Judgement:  Impaired  Insight:  Fair  Psychomotor Activity:  Restlessness  Concentration:  Fair  Recall:  [employer] of Knowledge:Fair  Language: Fair  Akathisia:  No  Handed:  Right  AIMS (if indicated):     Assets:  Communication Skills Desire for Improvement  ADL's:  Intact  Cognition: WNL  Sleep:  Number of Hours: 6.25   Treatment Plan Summary: Daily contact with patient to assess and evaluate symptoms and progress in treatment and Medication management   Will continue Abilify 5 mg po qhs for psychosis. Will continue Cogentin 0.5 mg po qhs for EPS. Will continue  Lamictal 25 mg po qhs for mood lability. Will increase Trazodone to 150 mg po qhs for sleep. Pt states he has a  hx of DM , Hba1c pending. Will continue to monitor vitals ,medication compliance and treatment side effects while patient is here.  Will monitor for medical issues as well as call consult as needed.  Reviewed labs TSH - wnl, lipid panel - wnl , pending PL - since pt is on neuroleptic. CSW will start working on disposition.  Patient to participate in therapeutic milieu .     Shem Plemmons MD 03/30/2015, 12:49 PM

## 2015-03-30 NOTE — Progress Notes (Addendum)
D: Pt presents anxious on approach. Pt laughing and joking around with staff and pts on the unit. Pt denies suicidal thoughts. Pt denies depression today. Pt denies AVH today. Pt reported difficulty sleeping last night, up pacing hallway. Pt med compliant and attends groups. No complaints verbalized by pt. A:  Medications reviewed with pt. Verbal support given. Pt encouraged to attend groups. 15 minute checks performed for safety   R: Pt receptive to tx.

## 2015-03-30 NOTE — [facility] Group Notes (Signed)
[place] LCSW Group Therapy  03/30/2015 3:20 PM  Type of Therapy:  Group Therapy  Participation Level:  Active  Participation Quality:  Appropriate  Affect:  Excited  Cognitive:  Oriented  Insight:  Improving  Engagement in Therapy:  Engaged  Modes of Intervention:  Discussion, Exploration, Problem-solving and Support  Today's Topic: Overcoming Obstacles. Patients identified one short term goal and potential obstacles in reaching this goal. Patients processed barriers involved in overcoming these obstacles. Patients identified steps necessary for overcoming these obstacles and explored motivation (internal and external) for facing these difficulties head on.   Patient quiet in group, appeared to listen attentively to others and offered some comments.  Remembers adolescence w fondness, uses memories of pleasant times as way to remind himself of times when he coped better w stress.  Described learning to better control his angry outbursts - uses strategies to calm himself down and not react.  Shared this w group.     [name] 03/30/2015, 3:20 PM

## 2015-03-30 NOTE — Progress Notes (Signed)
The patient attended Karaoke group this evening and was appropriately. The patient sat by himself and had no interactions with his peers.

## 2015-03-30 NOTE — Progress Notes (Signed)
Patient ID: Victor Rowe, male   DOB: [birth year], 54 y.o.   MRN: [id number] D: Client seen in dayroom, watching TV and interacting with peers. Client reports admission has been helpful "meet some new friends" "group was good, comical, it was about who would you be if you were a super hero" Client reports he was admitted due to stress and depression, says he had visit from wife and son since admission.Client concerned about medications asking "will he give me prescription when I'm  discharged.  A: Writer encouraged client to speak with physician about discharge medication. Provided assurance that physician usually gives a prescription for medications. Reviewed medications, administered as ordered, monitored for side effects. Staff will monitor q64min for safety. R: Client is safe on the unit, attended karaoke.

## 2015-03-30 NOTE — Progress Notes (Signed)
Adult Psychoeducational Group Note  Date:  03/30/2015 Time:  0930  Group Topic/Focus:  Orientation:   The focus of this group is to educate the patient on the purpose and policies of crisis stabilization and provide a format to answer questions about their admission.  The group details unit policies and expectations of patients while admitted.  Participation Level:  Active  Participation Quality:  Appropriate  Affect:  Anxious  Cognitive:  Appropriate  Insight: Appropriate  Engagement in Group:  Engaged  Modes of Intervention:  Discussion and Education  Additional Comments:    Deyonna Fitzsimmons L 03/30/2015, 10:44 AM

## 2015-03-31 LAB — HEMOGLOBIN A1C
Hgb A1c MFr Bld: 4.2 % — ABNORMAL LOW (ref 4.8–5.6)
Mean Plasma Glucose: 74 mg/dL

## 2015-03-31 LAB — PROLACTIN: Prolactin: 5.8 ng/mL (ref 4.0–15.2)

## 2015-03-31 MED ORDER — BENZTROPINE MESYLATE 0.5 MG PO TABS: 0.5000 mg | ORAL_TABLET | Freq: Every day | ORAL | Status: DC

## 2015-03-31 MED ORDER — TRAZODONE HCL 150 MG PO TABS: 150.0000 mg | ORAL_TABLET | Freq: Every day | ORAL | Status: DC

## 2015-03-31 MED ORDER — ARIPIPRAZOLE 5 MG PO TABS: 5.0000 mg | ORAL_TABLET | Freq: Every day | ORAL | Status: DC

## 2015-03-31 MED ORDER — LAMOTRIGINE 25 MG PO TABS: 25.0000 mg | ORAL_TABLET | Freq: Every day | ORAL | Status: DC

## 2015-03-31 NOTE — [facility] Suicide Risk Assessment (Addendum)
[place] INPATIENT:  Family/Significant Other Suicide Prevention Education  Suicide Prevention Education:  Education Completed; Victor Rowe [phone]),  (name of family member/significant other) has been identified by the patient as the family member/significant other with whom the patient will be residing, and identified as the person(s) who will aid the patient in the event of a mental health crisis (suicidal ideations/suicide attempt).  With written consent from the patient, the family member/significant other has been provided the following suicide prevention education, prior to the and/or following the discharge of the patient.  The suicide prevention education provided includes the following:  Suicide risk factors  Suicide prevention and interventions  National Suicide Hotline telephone number  [facility] assessment telephone number  [facility] Emergency Assistance [place] and/or Residential Mobile Crisis Unit telephone number  Request made of family/significant other to:  Remove weapons (e.g., guns, rifles, knives), all items previously/currently identified as safety concern.    Remove drugs/medications (over-the-counter, prescriptions, illicit drugs), all items previously/currently identified as a safety concern.  The family member/significant other verbalizes understanding of the suicide prevention education information provided.  The family member/significant other agrees to remove the items of safety concern listed above.  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  Familiar w [employer], is rep payee for patient's check.  Gave info on follow up appts, fiancee appreciative of information and will help pt follow up.  States pt has no access to weapons or large amounts of medications.    Victor Rowe 03/31/2015, 6:00 PM

## 2015-03-31 NOTE — Progress Notes (Signed)
  [facility] Adult Case Management Discharge Plan :  Will you be returning to the same living situation after discharge:  Yes,  return home At discharge, do you have transportation home?: Yes,  fiancee will transport - bus pass in chart if needed Do you have the ability to pay for your medications: Yes,  has insurance  Release of information consent forms completed and in the chart;  Patient's signature needed at discharge.  Patient to Follow up at: Follow-up Information    Follow up with [facility]  On 04/12/2015.   Why:  Patient current w this provider.  Has therapist visit at patient's home every Tuesday.  Is scheduled to see psychiatrist on 04/12/15 at 10:30 AM.    Contact information:   [address] [place]   Phone:  [phone] Fax:  [phone]      Safety Planning and Suicide Prevention discussed: Yes,  reviewed w fiancee, Odella Aquas; also [employer] brochure given to patient  Have you used any form of tobacco in the last 30 days? (Cigarettes, Smokeless Tobacco, Cigars, and/or Pipes): Yes  Has patient been referred to the Quitline?: Patient refused referral  [name] 03/31/2015, 3:32 PM

## 2015-03-31 NOTE — Plan of Care (Signed)
Problem: Alteration in mood Goal: STG-Patient reports thoughts of self-harm to staff Outcome: Progressing Client is safe on the unit AEB q54min safety checks, denies SI.

## 2015-03-31 NOTE — Discharge Summary (Signed)
Physician Discharge Summary Note  Patient:  Victor Rowe is an 54 y.o., male MRN:  [id number] DOB:  [birth year] Patient phone:  [phone] (home)  Patient address:   [address]  [place] [address],  Total Time spent with patient: 30 minutes  Date of Admission:  03/29/2015 Date of Discharge: 04/01/2015  Reason for Admission:  psychosis  Principal Problem: Schizoaffective disorder, bipolar type [facility]) Discharge Diagnoses: Patient Active Problem List   Diagnosis Date Noted  . Cannabis use disorder, mild, abuse [F12.10] 03/29/2015  . Schizoaffective disorder, bipolar type ([place]) [F25.0] 03/29/2015  . Alcohol use disorder, moderate, dependence ([place]) [F10.20] 03/29/2015  . Hepatic cirrhosis ([place]) [K74.60] 01/24/2015  . Chronic hepatitis C without hepatic coma ([place]) [B18.2] 11/02/2014    Musculoskeletal: Strength & Muscle Tone: within normal limits Gait & Station: normal Patient leans: N/A  Psychiatric Specialty Exam:  SEE MD SRA Physical Exam  Vitals reviewed. Psychiatric: His mood appears not anxious. Thought content is not paranoid and not delusional. He does not exhibit a depressed mood. He expresses no homicidal and no suicidal ideation. He expresses no suicidal plans and no homicidal plans.    Review of Systems  Psychiatric/Behavioral: Negative for depression, suicidal ideas and substance abuse.  All other systems reviewed and are negative.   Blood pressure 106/86, pulse 84, temperature 97.5 F (36.4 C), temperature source Oral, resp. rate 18, height 5' 10.5" (1.791 m), weight 100.699 kg (222 lb).Body mass index is 31.39 kg/(m^2).  Have you used any form of tobacco in the last 30 days? (Cigarettes, Smokeless Tobacco, Cigars, and/or Pipes): Yes  Has this patient used any form of tobacco in the last 30 days? (Cigarettes, Smokeless Tobacco, Cigars, and/or Pipes) No  Past Medical History:  Past Medical History  Diagnosis Date  . Asthma   . Depression   . Anxiety   .  Hepatitis     Hep C  Pt states he has taken a 12 week medication for disease   History reviewed. No pertinent past surgical history. Family History:  Family History  Problem Relation Age of Onset  . Alcoholism Father    Social History:  History  Alcohol Use No    Comment: Pt states he stopped drinking 12 weeks ago     History  Drug Use  . Yes  . Special: Marijuana    Social History   Social History  . Marital Status: Single    Spouse Name: N/A  . Number of Children: N/A  . Years of Education: N/A   Social History Main Topics  . Smoking status: Current Every Day Smoker -- 0.50 packs/day    Types: Cigarettes  . Smokeless tobacco: Never Used     Comment: "future goal, not right now"  . Alcohol Use: No     Comment: Pt states he stopped drinking 12 weeks ago  . Drug Use: Yes    Special: Marijuana  . Sexual Activity: Yes   Other Topics Concern  . None   Social History Narrative   Risk to Self: Is patient at risk for suicide?: Yes What has been your use of drugs/alcohol within the last 12 months?: stopped alcohol 12 weeks ago when he was put on medications for Hep C; occasional use of marijuana which he wants to stop; when drinking drank 2 40 oz beers, then "went home and went to sleep", "I dont want to end up an alcoholic like my father" Risk to Others:   Prior Inpatient Therapy:   Prior Outpatient  Therapy:    Level of Care:  OP  Hospital Course:  Victor Rowe was admitted for Schizoaffective disorder, bipolar type ([place]) and crisis management.  He was treated discharged with the medications listed below under Medication List.  Medical problems were identified and treated as needed.  Home medications were restarted as appropriate.  Improvement was monitored by observation and Victor Rowe daily report of symptom reduction.  Emotional and mental status was monitored by daily self-inventory reports completed by Victor Rowe and clinical staff.         Victor Rowe was  evaluated by the treatment team for stability and plans for continued recovery upon discharge.  Victor Rowe motivation was an integral factor for scheduling further treatment.  Employment, transportation, bed availability, health status, family support, and any pending legal issues were also considered during his hospital stay.  He was offered further treatment options upon discharge including but not limited to Residential, Intensive Outpatient, and Outpatient treatment.  Victor Rowe will follow up with the services as listed below under Follow Up Information.     Upon completion of this admission the patient was both mentally and medically stable for discharge denying suicidal/homicidal ideation, auditory/visual/tactile hallucinations, delusional thoughts and paranoia.      Consults:  psychiatry  Significant Diagnostic Studies:  labs: per lab  Discharge Vitals:   Blood pressure 106/86, pulse 84, temperature 97.5 F (36.4 C), temperature source Oral, resp. rate 18, height 5' 10.5" (1.791 m), weight 100.699 kg (222 lb). Body mass index is 31.39 kg/(m^2). Lab Results:   Results for orders placed or performed during the hospital encounter of 03/29/15 (from the past 72 hour(s))  Glucose, capillary     Status: Abnormal   Collection Time: 03/29/15  4:54 PM  Result Value Ref Range   Glucose-Capillary 108 (H) 65 - 99 mg/dL   Comment 1 Notify RN    Comment 2 Document in Chart   Glucose, capillary     Status: None   Collection Time: 03/29/15  9:08 PM  Result Value Ref Range   Glucose-Capillary 94 65 - 99 mg/dL  Glucose, capillary     Status: Abnormal   Collection Time: 03/30/15  6:15 AM  Result Value Ref Range   Glucose-Capillary 100 (H) 65 - 99 mg/dL  TSH     Status: None   Collection Time: 03/30/15  7:00 AM  Result Value Ref Range   TSH 1.170 0.350 - 4.500 uIU/mL    Comment: Performed at [facility]  Lipid panel     Status: Abnormal   Collection Time: 03/30/15  7:00  AM  Result Value Ref Range   Cholesterol 186 0 - 200 mg/dL   Triglycerides 93 <150 mg/dL   HDL 54 >40 mg/dL   Total CHOL/HDL Ratio 3.4 RATIO   VLDL 19 0 - 40 mg/dL   LDL Cholesterol 113 (H) 0 - 99 mg/dL    Comment:        Total Cholesterol/HDL:CHD Risk Coronary Heart Disease Risk Table                     Men   Women  1/2 Average Risk   3.4   3.3  Average Risk       5.0   4.4  2 X Average Risk   9.6   7.1  3 X Average Risk  23.4   11.0        Use the calculated Patient Ratio above and the  CHD Risk Table to determine the patient's CHD Risk.        ATP III CLASSIFICATION (LDL):  <100     mg/dL   Optimal  100-129  mg/dL   Near or Above                    Optimal  130-159  mg/dL   Borderline  160-189  mg/dL   High  >190     mg/dL   Very High Performed at [facility]   Hemoglobin A1c     Status: Abnormal   Collection Time: 03/30/15  7:00 AM  Result Value Ref Range   Hgb A1c MFr Bld 4.2 (L) 4.8 - 5.6 %    Comment: (NOTE)         Pre-diabetes: 5.7 - 6.4         Diabetes: >6.4         Glycemic control for adults with diabetes: <7.0    Mean Plasma Glucose 74 mg/dL    Comment: (NOTE) Performed At: [facility] [place], [place] [id number] Lindon Romp MD [id number] Performed at [facility]   Prolactin     Status: None   Collection Time: 03/30/15  7:00 AM  Result Value Ref Range   Prolactin 5.8 4.0 - 15.2 ng/mL    Comment: (NOTE) Performed At: [facility] [place], [place] [id number] Lindon Romp MD [id number] Performed at [facility]     Physical Findings: AIMS: Facial and Oral Movements Muscles of Facial Expression: None, normal Lips and Perioral Area: None, normal Jaw: None, normal Tongue: None, normal,Extremity Movements Upper (arms, wrists, hands, fingers): None, normal Lower (legs, knees, ankles, toes): None, normal, Trunk Movements Neck, shoulders,  hips: None, normal, Overall Severity Severity of abnormal movements (highest score from questions above): None, normal Incapacitation due to abnormal movements: None, normal Patient's awareness of abnormal movements (rate only patient's report): No Awareness, Dental Status Current problems with teeth and/or dentures?: Yes Does patient usually wear dentures?: Yes  CIWA:  CIWA-Ar Total: 0 COWS:  COWS Total Score: 0   See Psychiatric Specialty Exam and Suicide Risk Assessment completed by Attending Physician prior to discharge.  Discharge destination:  Home  Is patient on multiple antipsychotic therapies at discharge:  No   Has Patient had three or more failed trials of antipsychotic monotherapy by history:  No    Recommended Plan for Multiple Antipsychotic Therapies: NA     Medication List    STOP taking these medications        albuterol 108 (90 BASE) MCG/ACT inhaler  Commonly known as:  PROVENTIL HFA;VENTOLIN HFA     [place]-Paritapre-Ritona-Dasab 12.5-75-50 &250 MG Tbpk  Commonly known as:  VIEKIRA PAK     ribavirin 200 MG tablet  Commonly known as:  COPEGUS      TAKE these medications      Indication   ARIPiprazole 5 MG tablet  Commonly known as:  ABILIFY  Take 1 tablet (5 mg total) by mouth at bedtime.   Indication:  mood stabilization     benztropine 0.5 MG tablet  Commonly known as:  COGENTIN  Take 1 tablet (0.5 mg total) by mouth at bedtime.   Indication:  Extrapyramidal Reaction caused by Medications     lamoTRIgine 25 MG tablet  Commonly known as:  LAMICTAL  Take 1 tablet (25 mg total) by mouth at bedtime.   Indication:  mood stabilization  traZODone 150 MG tablet  Commonly known as:  DESYREL  Take 1 tablet (150 mg total) by mouth at bedtime.   Indication:  Trouble Sleeping           Follow-up Information    Follow up with [facility]  On 04/12/2015.   Why:  Patient current w this provider.  Has therapist visit at patient's home every  Tuesday.  Is scheduled to see psychiatrist on 04/12/15 at 10:30 AM.    Contact information:   [address] [place]   Phone:  [phone] Fax:  [phone]      Follow-up recommendations:  Activity:  as tol Diet:  as tol  Comments:  1.  Take all your medications as prescribed.              2.  Report any adverse side effects to outpatient provider.                       3.  Patient instructed to not use alcohol or illegal drugs while on prescription medicines.            4.  In the event of worsening symptoms, instructed patient to call 911, the crisis hotline or go to nearest emergency room for evaluation of symptoms.  Total Discharge Time:  30 min  Signed: Freda Munro May Agustin AGNP-BC 03/31/2015, 5:50 PM  I personally assessed the patient and formulated the plan Victor Rowe, M.D.

## 2015-03-31 NOTE — Progress Notes (Signed)
[facility] MD Progress Note  03/31/2015 12:42 PM Victor Rowe  MRN:  [id number] Subjective: Patient states " I am better today. '  Objective:Pt is a 54 yo AA male who has a hx of schizoaffective do , is on SSD, lives with his fiance , presented to the ED with SI/HI/AVH.Per initial notes in EHR " Pt's significant other reported that pt has a hx of schizophrenia but has never taken medications for it. Pt was hearing voices to kill self and others and pt reported on admission he sometimes sees people when they are not there. Pt's significant other found pt 03/28/2015 am banging on the ground and yelling. "   Patient seen and chart reviewed.Discussed patient with treatment team. Pt today seen as less labile and anxious . Pt reports sleep as fair last night. Tolerating his medications well. Pt denies SI today . Pt denies any withdrawal sx. Pt encouraged to attend group activities. Per staff -  denies any disruptive issues on the unit.    Principal Problem: Schizoaffective disorder, bipolar type ([place]) Diagnosis:   Patient Active Problem List   Diagnosis Date Noted  . Cannabis use disorder, mild, abuse [F12.10] 03/29/2015  . Schizoaffective disorder, bipolar type ([place]) [F25.0] 03/29/2015  . Alcohol use disorder, moderate, dependence ([place]) [F10.20] 03/29/2015  . Hepatic cirrhosis ([place]) [K74.60] 01/24/2015  . Chronic hepatitis C without hepatic coma ([place]) [B18.2] 11/02/2014   Total Time spent with patient: 25 minutes  Past Psychiatric History: Patient with hx of Bipolar disorder/Schizophrenia - diagnosed years ago while in [place]. Pt was hospitalized at [place] years ago , was on Thorazine in the past . Pt reports hx of suicide attempt x1 . Pt currently is noncompliant on his medications. States he tried to go to [facility] but stopped , since they were too busy.   Past Medical History:  Past Medical History  Diagnosis Date  . Asthma   . Depression   . Anxiety   . Hepatitis    Hep C  Pt states he has taken a 12 week medication for disease   History reviewed. No pertinent past surgical history. Family History:  Family History  Problem Relation Age of Onset  . Alcoholism Father    Family Psychiatric  History: Father with hx of alcoholism. Denies mental illness.  Social History: Patient lives with fiance. Has 10 children who are grown. Pt is on SSD. Pt has filed for divorce from his ex wife who is in [place]. Pt has had several legal issues in the past - none pending at this time.  History  Alcohol Use No    Comment: Pt states he stopped drinking 12 weeks ago     History  Drug Use  . Yes  . Special: Marijuana    Social History   Social History  . Marital Status: Single    Spouse Name: N/A  . Number of Children: N/A  . Years of Education: N/A   Social History Main Topics  . Smoking status: Current Every Day Smoker -- 0.50 packs/day    Types: Cigarettes  . Smokeless tobacco: Never Used     Comment: "future goal, not right now"  . Alcohol Use: No     Comment: Pt states he stopped drinking 12 weeks ago  . Drug Use: Yes    Special: Marijuana  . Sexual Activity: Yes   Other Topics Concern  . None   Social History Narrative   Additional Social History:    Pain Medications: Pt  denies Prescriptions: Pt denies Over the Counter: Pt denies History of alcohol / drug use?: Yes Longest period of sobriety (when/how long): Pt states he has been sober for the past 12 weeks Negative Consequences of Use: Financial, Personal relationships                    Sleep: Fair  Appetite:  Fair  Current Medications: Current Facility-Administered Medications  Medication Dose Route Frequency Provider Last Rate Last Dose  . acetaminophen (TYLENOL) tablet 650 mg  650 mg Oral Q6H PRN Laverle Hobby, PA-C      . alum & mag hydroxide-simeth (MAALOX/MYLANTA) 200-200-20 MG/5ML suspension 30 mL  30 mL Oral Q4H PRN Laverle Hobby, PA-C      . ARIPiprazole  (ABILIFY) tablet 5 mg  5 mg Oral QHS Ursula Alert, MD   5 mg at 03/30/15 2152  . benztropine (COGENTIN) tablet 0.5 mg  0.5 mg Oral QHS Giavanni Odonovan, MD   0.5 mg at 03/30/15 2152  . hydrOXYzine (ATARAX/VISTARIL) tablet 25 mg  25 mg Oral TID PRN Laverle Hobby, PA-C      . lamoTRIgine (LAMICTAL) tablet 25 mg  25 mg Oral QHS Ursula Alert, MD   25 mg at 03/30/15 2152  . risperiDONE (RISPERDAL M-TABS) disintegrating tablet 2 mg  2 mg Oral Q8H PRN Laverle Hobby, PA-C       And  . LORazepam (ATIVAN) tablet 1 mg  1 mg Oral PRN Laverle Hobby, PA-C       And  . ziprasidone (GEODON) injection 20 mg  20 mg Intramuscular PRN Laverle Hobby, PA-C      . magnesium hydroxide (MILK OF MAGNESIA) suspension 30 mL  30 mL Oral Daily PRN Laverle Hobby, PA-C      . nicotine (NICODERM CQ - dosed in mg/24 hours) patch 21 mg  21 mg Transdermal Daily Rether Rison, MD   21 mg at 03/29/15 0800  . traZODone (DESYREL) tablet 150 mg  150 mg Oral QHS Ursula Alert, MD   150 mg at 03/30/15 2152    Lab Results:  Results for orders placed or performed during the hospital encounter of 03/29/15 (from the past 48 hour(s))  Glucose, capillary     Status: Abnormal   Collection Time: 03/29/15  4:54 PM  Result Value Ref Range   Glucose-Capillary 108 (H) 65 - 99 mg/dL   Comment 1 Notify RN    Comment 2 Document in Chart   Glucose, capillary     Status: None   Collection Time: 03/29/15  9:08 PM  Result Value Ref Range   Glucose-Capillary 94 65 - 99 mg/dL  Glucose, capillary     Status: Abnormal   Collection Time: 03/30/15  6:15 AM  Result Value Ref Range   Glucose-Capillary 100 (H) 65 - 99 mg/dL  TSH     Status: None   Collection Time: 03/30/15  7:00 AM  Result Value Ref Range   TSH 1.170 0.350 - 4.500 uIU/mL    Comment: Performed at [facility]  Lipid panel     Status: Abnormal   Collection Time: 03/30/15  7:00 AM  Result Value Ref Range   Cholesterol 186 0 - 200 mg/dL   Triglycerides  93 <150 mg/dL   HDL 54 >40 mg/dL   Total CHOL/HDL Ratio 3.4 RATIO   VLDL 19 0 - 40 mg/dL   LDL Cholesterol 113 (H) 0 - 99 mg/dL    Comment:  Total Cholesterol/HDL:CHD Risk Coronary Heart Disease Risk Table                     Men   Women  1/2 Average Risk   3.4   3.3  Average Risk       5.0   4.4  2 X Average Risk   9.6   7.1  3 X Average Risk  23.4   11.0        Use the calculated Patient Ratio above and the CHD Risk Table to determine the patient's CHD Risk.        ATP III CLASSIFICATION (LDL):  <100     mg/dL   Optimal  100-129  mg/dL   Near or Above                    Optimal  130-159  mg/dL   Borderline  160-189  mg/dL   High  >190     mg/dL   Very High Performed at [facility]   Hemoglobin A1c     Status: Abnormal   Collection Time: 03/30/15  7:00 AM  Result Value Ref Range   Hgb A1c MFr Bld 4.2 (L) 4.8 - 5.6 %    Comment: (NOTE)         Pre-diabetes: 5.7 - 6.4         Diabetes: >6.4         Glycemic control for adults with diabetes: <7.0    Mean Plasma Glucose 74 mg/dL    Comment: (NOTE) Performed At: [facility] [place], [place] [id number] Lindon Romp MD [id number] Performed at [facility]   Prolactin     Status: None   Collection Time: 03/30/15  7:00 AM  Result Value Ref Range   Prolactin 5.8 4.0 - 15.2 ng/mL    Comment: (NOTE) Performed At: [facility] [place], [place] [id number] Lindon Romp MD [id number] Performed at [facility]     Physical Findings: AIMS: Facial and Oral Movements Muscles of Facial Expression: None, normal Lips and Perioral Area: None, normal Jaw: None, normal Tongue: None, normal,Extremity Movements Upper (arms, wrists, hands, fingers): None, normal Lower (legs, knees, ankles, toes): None, normal, Trunk Movements Neck, shoulders, hips: None, normal, Overall Severity Severity of abnormal movements (highest  score from questions above): None, normal Incapacitation due to abnormal movements: None, normal Patient's awareness of abnormal movements (rate only patient's report): No Awareness, Dental Status Current problems with teeth and/or dentures?: Yes Does patient usually wear dentures?: Yes  CIWA:  CIWA-Ar Total: 0 COWS:  COWS Total Score: 0  Musculoskeletal: Strength & Muscle Tone: within normal limits Gait & Station: normal Patient leans: N/A  Psychiatric Specialty Exam: Review of Systems  Psychiatric/Behavioral: Positive for substance abuse. The patient is nervous/anxious. The patient does not have insomnia.   All other systems reviewed and are negative.   Blood pressure 106/86, pulse 84, temperature 97.5 F (36.4 C), temperature source Oral, resp. rate 18, height 5' 10.5" (1.791 m), weight 100.699 kg (222 lb).Body mass index is 31.39 kg/(m^2).  General Appearance: Fairly Groomed  Engineer, water::  Fair  Speech:  Pressured improved  Volume:  Normal  Mood:  Anxious improved  Affect:  Labile  Thought Process:  Goal Directed  Orientation:  Full (Time, Place, and Person)  Thought Content:  Rumination  Suicidal Thoughts:  No  Homicidal Thoughts:  No  Memory:  Immediate;   Fair Recent;   Fair Remote;   Fair  Judgement:  Impaired  Insight:  Fair  Psychomotor Activity:  Normal  Concentration:  Fair  Recall:  [employer] of Knowledge:Fair  Language: Fair  Akathisia:  No  Handed:  Right  AIMS (if indicated):     Assets:  Communication Skills Desire for Improvement  ADL's:  Intact  Cognition: WNL  Sleep:  Number of Hours: 6.25   Treatment Plan Summary: Daily contact with patient to assess and evaluate symptoms and progress in treatment and Medication management   Will continue Abilify 5 mg po qhs for psychosis. Will continue Cogentin 0.5 mg po qhs for EPS. Will continue  Lamictal 25 mg po qhs for mood lability. Will continue Trazodone  150 mg po qhs for sleep. Pt states he  has a hx of DM , Hba1c 4.2. ( 03/30/15) Will continue to monitor vitals ,medication compliance and treatment side effects while patient is here.  Will monitor for medical issues as well as call consult as needed.  Reviewed labs TSH - wnl, lipid panel - wnl ,  PL- wnl  - since pt is on neuroleptic. CSW will start working on disposition.  Patient to participate in therapeutic milieu .     Carsynn Bethune MD 03/31/2015, 12:42 PM

## 2015-03-31 NOTE — Progress Notes (Signed)
Patient ID: Victor Rowe, male   DOB: February 09, 1961, 54 y.o.   MRN: [id number] D: Client reports day was "good" "funny" "going home tomorrow" "groups, love them, today talked about what we would like to do" "I would like to chang the past, clean up some things" Client reports he been reading his bible and meditating today. A: Writer encouraged client to continue therapy and medications upon discharged. Reviewed medications, admonished client to report and side effects. Staff will monitor q85min for safety. R:Client is safe on the unit, attended group.

## 2015-03-31 NOTE — Progress Notes (Signed)
Patient ID: Victor Rowe, male   DOB: 1960/07/12, 54 y.o.   MRN: [id number] D  ---   Pt. Denies pain or dis-comfort at this time.  He is friendly but suspicious of staff .  He agrees to Soil scientist for safety.  Pt. Interacts well with peers except for one incidendt in dayroom   with a male peer this AM.  Pt. Had disagreement with peer but was able to maintain control of his actions and conflict was quickly resolved without issue.    Pt. Takes medications and states no adverse reactions.  He is looking forward to DC tomorrow.  ---  A ---  Support, encouragement  And safety cks. Provided.  --- R ---  Pt. Remains safe on unit

## 2015-03-31 NOTE — BHH Group Notes (Signed)
[facility] LCSW Aftercare Discharge Planning Group Note   03/31/2015 3:37 PM  Participation Quality:  Invited, did not attend   [name]

## 2015-03-31 NOTE — BHH Group Notes (Signed)
[place] [facility] LCSW Group Therapy  03/31/2015 1:46 PM   Type of Therapy:  Group Therapy  Participation Level:  Active  Participation Quality:  Appropriate, Attentive, Sharing and Supportive  Affect:  Appropriate  Cognitive:  Appropriate  Insight:  Developing/Improving  Engagement in Therapy:  Engaged  Modes of Intervention:  Discussion, Exploration, Socialization and Support  Summary of Progress/Problems:  Chaplain led group explored concept of hope and its relevance to mental health recovery.  Patients explored themes including what matters to them personally, how others responses are similar/different, and what they are hopeful for.  Group members discussed relevance of social supports, innter strength and using their own stories to craft a recovery path.  Patient described his experience w caring for his dog, states that he values this relationship because dog is very connected to patient, patient feels loved and accepted by pet.    Victor Rowe

## 2015-03-31 NOTE — Plan of Care (Signed)
Problem: Alteration in mood Goal: STG-Patient is able to discuss feelings and issues (Patient is able to discuss feelings and issues leading to depression)  Outcome: Progressing Client is able to discuss feelings, reports stress at home lead to increase depression, "but my wife and son came to see me yesterday, she got off her job"

## 2015-03-31 NOTE — Progress Notes (Signed)
[facility] Group Notes:  (Nursing/MHT/Case Management/Adjunct)  Date:  03/31/2015  Time:  8:56 PM  Type of Therapy:  Psychoeducational Skills  Participation Level:  Active  Participation Quality:  Appropriate  Affect:  Appropriate  Cognitive:  Appropriate  Insight:  Improving  Engagement in Group:  Engaged  Modes of Intervention:  Education  Summary of Progress/Problems: Patient states that he had a "great day" overall. He attributed his positive day to being able to enjoy the groups and because he was able to listen to his peers. He states that his peers offered him support along with some helpful suggestions. Finally, he indicated that he is trying to "open up more" to his peers rather than keeping to himself and not talking. As for the theme of the day, his relapse prevention will include staying away from bad people and taking better care of himself.   [name] S 03/31/2015, 8:56 PM

## 2015-04-01 NOTE — Progress Notes (Signed)
Patient ID: Victor Rowe, male   DOB: 27-May-1961, 54 y.o.   MRN: [id number]   Pt was discharged home with a bus pass. Pt reported that he was ready for discharge, he reported that his depression was a 0 and that his hopelessness was a 0. Pt reported that he was negative SI/HI, no AH/VH noted. Pt was given his belongings , his prescriptions, and his discharge instructions.

## 2015-04-01 NOTE — [facility] Group Notes (Signed)
[facility] Group Notes:  (Clinical Social Work)  04/01/2015  11:15-12:00PM  Summary of Progress/Problems:   The main focus of today's process group was to discuss patients' feelings related to being hospitalized.  It was agreed in general by the group that it would be preferable to avoid future hospitalizations, and we discussed means of doing that.  As a follow-up, problems with adhering to medication recommendations were discussed.  The patient expressed his primary feeling about being hospitalized is all positive.  He did not participate a lot in group, read a magazine and then went to meet with a practitioner about discharge.  Type of Therapy:  Group Therapy - Process  Participation Level:  Minimal  Participation Quality:  Attentive and Inattentive  Affect:  Appropriate  Cognitive:  Alert  Insight:  Developing/Improving  Engagement in Therapy:  Limited  Modes of Intervention:  Exploration, Discussion  Selmer Dominion, LCSW 04/01/2015, 12:33 PM

## 2015-04-01 NOTE — [facility] Suicide Risk Assessment (Signed)
[facility] Discharge Suicide Risk Assessment   Demographic Factors:  Male  Total Time spent with patient: 30 minutes  Musculoskeletal: Strength & Muscle Tone: within normal limits Gait & Station: normal Patient leans: normal  Psychiatric Specialty Exam: Physical Exam  Review of Systems  Constitutional: Negative.   HENT: Negative.   Eyes: Negative.   Respiratory: Negative.   Cardiovascular: Negative.   Gastrointestinal: Negative.   Genitourinary: Negative.   Musculoskeletal: Negative.   Skin: Negative.   Neurological: Negative.   Endo/Heme/Allergies: Negative.   Psychiatric/Behavioral: Positive for substance abuse.    Blood pressure 104/62, pulse 92, temperature 97.9 F (36.6 C), temperature source Oral, resp. rate 16, height 5' 10.5" (1.791 m), weight 100.699 kg (222 lb).Body mass index is 31.39 kg/(m^2).  General Appearance: Fairly Groomed  Engineer, water::  Fair  Speech:  Clear and Coherent  Volume:  Normal  Mood:  Euthymic  Affect:  Appropriate  Thought Process:  Coherent and Goal Directed  Orientation:  Full (Time, Place, and Person)  Thought Content:  plans as he moves on, his plans to go off the alcohol and the marijuana  Suicidal Thoughts:  No  Homicidal Thoughts:  No  Memory:  Immediate;   Fair Recent;   Fair Remote;   Fair  Judgement:  Fair  Insight:  Present  Psychomotor Activity:  Normal  Concentration:  Fair  Recall:  [employer] [place]  Language: Fair  Akathisia:  No  Handed:  Right  AIMS (if indicated):     Assets:  Desire for Improvement Housing Social Support  Sleep:  Number of Hours: 6.5  Cognition: WNL  ADL's:  Intact   Have you used any form of tobacco in the last 30 days? (Cigarettes, Smokeless Tobacco, Cigars, and/or Pipes): Yes  Has this patient used any form of tobacco in the last 30 days? (Cigarettes, Smokeless Tobacco, Cigars, and/or Pipes) Yes, A prescription for an FDA-approved tobacco cessation medication was offered at  discharge and the patient refused  Mental Status Per Nursing Assessment::   On Admission:   (Pt denies SI/HI on admission)  Current Mental Status by Physician: In full contact with reality. There are no active SI plans or intent. He states he is ready to go home and see his dog. He states he feels much better than when he came in. He is going to continue to work on abstaining. He is going to comply with medications and follow up with [facility]  Loss Factors: NA  Historical Factors: NA  Risk Reduction Factors:   Sense of responsibility to family and Positive social support  Continued Clinical Symptoms:  Alcohol/Substance Abuse/Dependencies  Cognitive Features That Contribute To Risk:  Closed-mindedness, Polarized thinking and Thought constriction (tunnel vision)    Suicide Risk:  Minimal: No identifiable suicidal ideation.  Patients presenting with no risk factors but with morbid ruminations; may be classified as minimal risk based on the severity of the depressive symptoms  Principal Problem: Schizoaffective disorder, bipolar type [facility]) Discharge Diagnoses:  Patient Active Problem List   Diagnosis Date Noted  . Cannabis use disorder, mild, abuse [F12.10] 03/29/2015  . Schizoaffective disorder, bipolar type ([place]) [F25.0] 03/29/2015  . Alcohol use disorder, moderate, dependence ([place]) [F10.20] 03/29/2015  . Hepatic cirrhosis ([place]) [K74.60] 01/24/2015  . Chronic hepatitis C without hepatic coma ([place]) [B18.2] 11/02/2014    Follow-up Information    Follow up with [facility]  On 04/12/2015.   Why:  Patient current w this provider.  Has therapist visit  at patient's home every Tuesday.  Is scheduled to see psychiatrist on 04/12/15 at 10:30 AM.    Contact information:   [address] [place]   Phone:  [phone] Fax:  [phone]      Plan Of Care/Follow-up recommendations:  Activity:  as tolerated Diet:  heart healthy Follow up as above Is patient on  multiple antipsychotic therapies at discharge:  No   Has Patient had three or more failed trials of antipsychotic monotherapy by history:  No  Recommended Plan for Multiple Antipsychotic Therapies: NA    Darreon Lutes A 04/01/2015, 11:17 AM

## 2015-04-04 ENCOUNTER — Encounter: Payer: Self-pay | Admitting: Internal Medicine

## 2015-04-04 ENCOUNTER — Ambulatory Visit (INDEPENDENT_AMBULATORY_CARE_PROVIDER_SITE_OTHER): Payer: Medicaid Other | Admitting: Internal Medicine

## 2015-04-04 VITALS — BP 111/69 | HR 84 | Temp 97.9°F | Ht 72.0 in | Wt 230.0 lb

## 2015-04-04 DIAGNOSIS — Z23 Encounter for immunization: Secondary | ICD-10-CM | POA: Diagnosis present

## 2015-04-04 DIAGNOSIS — B182 Chronic viral hepatitis C: Secondary | ICD-10-CM | POA: Diagnosis present

## 2015-04-04 DIAGNOSIS — K7460 Unspecified cirrhosis of liver: Secondary | ICD-10-CM | POA: Diagnosis not present

## 2015-04-04 NOTE — Progress Notes (Signed)
   Subjective:    Patient ID: Victor Rowe, male    DOB: [birth year], 55 y.o.   MRN: [id number]  HPI Here for follow up of HCV.  Genotype 1a, viral load of 4.3 million, elastography with F3/4.  Started Rozann Lesches with weight based ribavirin started 7/8 and now has completed 12 weeks.  No issues, denies missed doses.  Labs reviewed with him and still undetectable.  Hgb stable at 13.  No fatigue, no headache.  Stopped drinking completely.  Hepatitis A and B immune.     Review of Systems  Constitutional: Negative for fatigue.  Gastrointestinal: Negative for nausea and diarrhea.  Skin: Negative for rash.  Neurological: Negative for dizziness, light-headedness and headaches.   SHx: has now been alcohol free since starting treatment.  History of daily drinking.  + tobacco with 1/2 pk per day.  No drug use.   Past Medical History  Diagnosis Date  . Asthma   . Depression   . Anxiety   . Hepatitis     Hep C  Pt states he has taken a 12 week medication for disease        Objective:   Physical Exam  Constitutional: He appears well-developed and well-nourished. No distress.  HENT:  Mouth/Throat: No oropharyngeal exudate.  Eyes: No scleral icterus.  Cardiovascular: Normal rate, regular rhythm and normal heart sounds.   No murmur heard. Pulmonary/Chest: Effort normal and breath sounds normal. No respiratory distress. He has no wheezes.  Lymphadenopathy:    He has no cervical adenopathy.  Skin: No rash noted.          Assessment & Plan:

## 2015-04-04 NOTE — Assessment & Plan Note (Signed)
Will need to see GI for varices screening.  Needs referral from his PCP due to [place] access.   Will do [place] screen every 6 months.

## 2015-04-04 NOTE — Assessment & Plan Note (Signed)
End of treatment viral load is negative.  Will check again in 6 months to confirm cure.  RTC 6 months.

## 2015-05-15 ENCOUNTER — Encounter: Payer: Self-pay | Admitting: Internal Medicine

## 2015-08-21 ENCOUNTER — Encounter (HOSPITAL_COMMUNITY): Payer: Self-pay | Admitting: Emergency Medicine

## 2015-08-21 ENCOUNTER — Emergency Department (HOSPITAL_COMMUNITY)
Admission: EM | Admit: 2015-08-21 | Discharge: 2015-08-21 | Disposition: A | Discharge status: Home / Self Care | Payer: Medicaid Other | Attending: Physician Assistant | Admitting: Physician Assistant

## 2015-08-21 ENCOUNTER — Emergency Department (HOSPITAL_COMMUNITY): Payer: Medicaid Other

## 2015-08-21 DIAGNOSIS — Z8619 Personal history of other infectious and parasitic diseases: Secondary | ICD-10-CM | POA: Diagnosis not present

## 2015-08-21 DIAGNOSIS — R059 Cough, unspecified: Secondary | ICD-10-CM

## 2015-08-21 DIAGNOSIS — F419 Anxiety disorder, unspecified: Secondary | ICD-10-CM | POA: Diagnosis not present

## 2015-08-21 DIAGNOSIS — Z79899 Other long term (current) drug therapy: Secondary | ICD-10-CM | POA: Insufficient documentation

## 2015-08-21 DIAGNOSIS — J439 Emphysema, unspecified: Secondary | ICD-10-CM | POA: Diagnosis not present

## 2015-08-21 DIAGNOSIS — F17210 Nicotine dependence, cigarettes, uncomplicated: Secondary | ICD-10-CM | POA: Insufficient documentation

## 2015-08-21 DIAGNOSIS — J40 Bronchitis, not specified as acute or chronic: Secondary | ICD-10-CM | POA: Insufficient documentation

## 2015-08-21 DIAGNOSIS — R05 Cough: Secondary | ICD-10-CM

## 2015-08-21 MED ORDER — NICOTINE 21 MG/24HR TD PT24: 21.0000 mg | MEDICATED_PATCH | Freq: Every day | TRANSDERMAL | Status: DC

## 2015-08-21 NOTE — Discharge Instructions (Signed)
Your chest x-ray today showed evidence of emphysema and bronchitis. As we discussed, I highly encourage you to quite smoking. I will give you a prescription for nicotene patches. You may use one a day, and follow up with your primary care provider within one week for follow up and ongoing management of your symptoms. Return to the ER for new or worsening symptoms.

## 2015-08-21 NOTE — ED Notes (Signed)
Pt never back from x ray

## 2015-08-21 NOTE — ED Notes (Signed)
Pt. reports chronic productive cough with chest congestion for several weeks , denies fever or chills.

## 2015-08-21 NOTE — ED Provider Notes (Signed)
CSN: [id number]     Arrival date & time 08/21/15  1921 History  By signing my name below, I, Dora Sims, attest that this documentation has been prepared under the direction and in the presence of non-physician practitioner, Delrae Rend, PA-C. Electronically Signed: Dora Sims, Scribe. 08/21/2015. 8:56 PM.     Chief Complaint  Patient presents with  . Cough    The history is provided by the patient. No language interpreter was used.     HPI Comments: Victor Rowe is a 55 y.o. male with h/o asthma who presents to the Emergency Department complaining of constant productive cough onset one month. Pt notes that his sputum has been white, green, brown, and black. Pt reports that he coughs throughout the day regardless of what he is doing. Pt has not experienced a similar cough in the past. He also endorses associated chest congestion. Pt is a smoker but reports that he smokes less than 0.5 ppd. Pt denies SOB, fever, chills, nausea, vomiting, rhinorrhea, nasal congestion, or any other associated symptoms.   Past Medical History  Diagnosis Date  . Asthma   . Depression   . Anxiety   . Hepatitis     Hep C  Pt states he has taken a 12 week medication for disease   History reviewed. No pertinent past surgical history. Family History  Problem Relation Age of Onset  . Alcoholism Father    Social History  Substance Use Topics  . Smoking status: Current Every Day Smoker -- 0.00 packs/day    Types: Cigarettes  . Smokeless tobacco: Never Used     Comment: "future goal, not right now"  . Alcohol Use: Yes    Review of Systems  Constitutional: Negative for fever and chills.  HENT: Negative for congestion and rhinorrhea.   Respiratory: Positive for cough. Negative for shortness of breath.   Gastrointestinal: Negative for nausea and vomiting.      Allergies  Review of patient's allergies indicates no known allergies.  Home Medications   Prior to Admission medications   Medication  Sig Start Date End Date Taking? Authorizing Provider  ARIPiprazole (ABILIFY) 5 MG tablet Take 1 tablet (5 mg total) by mouth at bedtime. 03/31/15   Kerrie Buffalo, NP  benztropine (COGENTIN) 0.5 MG tablet Take 1 tablet (0.5 mg total) by mouth at bedtime. 03/31/15   Kerrie Buffalo, NP  lamoTRIgine (LAMICTAL) 25 MG tablet Take 1 tablet (25 mg total) by mouth at bedtime. 03/31/15   Kerrie Buffalo, NP  traZODone (DESYREL) 150 MG tablet Take 1 tablet (150 mg total) by mouth at bedtime. 03/31/15   Kerrie Buffalo, NP   BP 121/83 mmHg  Pulse 68  Temp(Src) 98.1 F (36.7 C) (Oral)  Resp 20  SpO2 99% Physical Exam  Constitutional: He is oriented to person, place, and time. He appears well-developed and well-nourished. No distress.  HENT:  Head: Normocephalic and atraumatic.  Eyes: Conjunctivae and EOM are normal.  Neck: Neck supple. No tracheal deviation present.  Cardiovascular: Normal rate.   Pulmonary/Chest: Effort normal and breath sounds normal. No tachypnea. No respiratory distress. He has no decreased breath sounds. He has no wheezes. He has no rales. He exhibits no tenderness.  Musculoskeletal: Normal range of motion.  Neurological: He is alert and oriented to person, place, and time.  Skin: Skin is warm and dry.  Psychiatric: He has a normal mood and affect. His behavior is normal.  Nursing note and vitals reviewed.   ED Course  Procedures (including critical  care time)  DIAGNOSTIC STUDIES: Oxygen Saturation is 99% on RA, normal by my interpretation.    COORDINATION OF CARE: 8:55 PM Will order DG Chest 2 View. Discussed treatment plan with pt at bedside and pt agreed to plan.   Labs Review Labs Reviewed - No data to display  Imaging Review Dg Chest 2 View  08/21/2015  CLINICAL DATA:  Cough for 3 weeks. EXAM: CHEST  2 VIEW COMPARISON:  08/28/2014 FINDINGS: Mild emphysematous changes in the lungs. Central interstitial fibrosis suggesting chronic bronchitis. Scarring in the lung  apices. No focal airspace disease or consolidation. No blunting of costophrenic angles. No pneumothorax. Mediastinal contours appear intact. IMPRESSION: Emphysematous changes and chronic bronchitic changes in the lungs. No evidence of active pulmonary disease. Electronically Signed   By: Lucienne Capers M.D.   On: 08/21/2015 21:12   I have personally reviewed and evaluated these images as part of my medical decision-making.   EKG Interpretation None      MDM   Final diagnoses:  Pulmonary emphysema, unspecified emphysema type (HCC)  Cough  Bronchitis    CXR shows emphysematous changes and chronic bronchitic changes. I hear no adventitious lung sounds on my exam. Pt is not hypoxic or tachypneic. He is stable for discharge and outpatient f/u. However, pt has never returned from x-ray. X-ray and staff unaware of where pt is.   Pt returned from outside. Discussed XR findings with him. Pt states he has albuterol inhaler at home and does not need one. He would like assistance in smoking cessation. Rx given for nicoderm patches. Pt does have PCP. Instructed f/u within one week for f/u and ongoing support/management with tobacco cessation and COPD. ER return precautions given.  I personally performed the services described in this documentation, which was scribed in my presence. The recorded information has been reviewed and is accurate.   Anne Ng, PA-C 08/21/15 2217  Courteney Julio Alm, MD 08/22/15 [phone]

## 2015-09-15 ENCOUNTER — Encounter (HOSPITAL_COMMUNITY): Payer: Self-pay | Admitting: Emergency Medicine

## 2015-09-15 ENCOUNTER — Emergency Department (HOSPITAL_COMMUNITY)
Admission: EM | Admit: 2015-09-15 | Discharge: 2015-09-15 | Disposition: A | Discharge status: Home / Self Care | Payer: Medicaid Other | Attending: Emergency Medicine | Admitting: Emergency Medicine

## 2015-09-15 DIAGNOSIS — R55 Syncope and collapse: Secondary | ICD-10-CM | POA: Insufficient documentation

## 2015-09-15 DIAGNOSIS — Z8619 Personal history of other infectious and parasitic diseases: Secondary | ICD-10-CM | POA: Diagnosis not present

## 2015-09-15 DIAGNOSIS — F17210 Nicotine dependence, cigarettes, uncomplicated: Secondary | ICD-10-CM | POA: Insufficient documentation

## 2015-09-15 DIAGNOSIS — J45909 Unspecified asthma, uncomplicated: Secondary | ICD-10-CM | POA: Diagnosis not present

## 2015-09-15 DIAGNOSIS — Z79899 Other long term (current) drug therapy: Secondary | ICD-10-CM | POA: Diagnosis not present

## 2015-09-15 DIAGNOSIS — I959 Hypotension, unspecified: Secondary | ICD-10-CM | POA: Diagnosis present

## 2015-09-15 DIAGNOSIS — F419 Anxiety disorder, unspecified: Secondary | ICD-10-CM | POA: Diagnosis not present

## 2015-09-15 DIAGNOSIS — F329 Major depressive disorder, single episode, unspecified: Secondary | ICD-10-CM | POA: Diagnosis not present

## 2015-09-15 LAB — I-STAT CHEM 8, ED
BUN: 10 mg/dL (ref 6–20)
CHLORIDE: 100 mmol/L — AB (ref 101–111)
CREATININE: 1.4 mg/dL — AB (ref 0.61–1.24)
Calcium, Ion: 1.07 mmol/L — ABNORMAL LOW (ref 1.12–1.23)
Glucose, Bld: 94 mg/dL (ref 65–99)
HEMATOCRIT: 43 % (ref 39.0–52.0)
Hemoglobin: 14.6 g/dL (ref 13.0–17.0)
Potassium: 4.2 mmol/L (ref 3.5–5.1)
Sodium: 139 mmol/L (ref 135–145)
TCO2: 23 mmol/L (ref 0–100)

## 2015-09-15 LAB — I-STAT TROPONIN, ED: TROPONIN I, POC: 0 ng/mL (ref 0.00–0.08)

## 2015-09-15 MED ORDER — SODIUM CHLORIDE 0.9 % IV BOLUS (SEPSIS)
1000.0000 mL | Freq: Once | INTRAVENOUS | Status: AC
  Administered 2015-09-15: 1000 mL via INTRAVENOUS

## 2015-09-15 NOTE — ED Notes (Signed)
To ED via [facility] -- with c/o passed out in friend's car-- pt had taken 2 fiorecet this morning without eating, for a h/a, and drank 1 beer & 1-- 1/2 pint wine. On EMS arrival to scene, pt was unresponsive, diaphoretic, vomited, with a bp of 89/60-- pt received 1000cc NS enroute to ED. On arrival pt is alert/oriented x 4, denies h/a at present.

## 2015-09-15 NOTE — ED Provider Notes (Signed)
CSN: [id number]     Arrival date & time 09/15/15  1255 History   First MD Initiated Contact with Patient 09/15/15 1318     Chief Complaint  Patient presents with  . hypotensive      (Consider location/radiation/quality/duration/timing/severity/associated sxs/prior Treatment) HPI...Marland KitchenMarland KitchenPatient apparently took 2 Fioricet tablets this morning for a headache without eating. He then drank 1 beer and 1 and 1/2 pints of wine. EMS was notified. He was initially unresponsive and diaphoretic. He is now alert and oriented without any somatic symptoms. He feels that his symptoms were all related to taking the medication and drinking alcohol. He states he is now completely back to normal. Patient states he has a "mental health problem" but is uncertain about the diagnosis.  Past Medical History  Diagnosis Date  . Asthma   . Depression   . Anxiety   . Hepatitis     Hep C  Pt states he has taken a 12 week medication for disease   History reviewed. No pertinent past surgical history. Family History  Problem Relation Age of Onset  . Alcoholism Father    Social History  Substance Use Topics  . Smoking status: Current Every Day Smoker -- 0.00 packs/day    Types: Cigarettes  . Smokeless tobacco: Never Used     Comment: "future goal, not right now"  . Alcohol Use: Yes     Comment: 16 --32 ounce can x 2  on weekends    Review of Systems  All other systems reviewed and are negative.     Allergies  Review of patient's allergies indicates no known allergies.  Home Medications   Prior to Admission medications   Medication Sig Start Date End Date Taking? Authorizing Provider  ARIPiprazole (ABILIFY) 5 MG tablet Take 1 tablet (5 mg total) by mouth at bedtime. 03/31/15   Kerrie Buffalo, NP  benztropine (COGENTIN) 0.5 MG tablet Take 1 tablet (0.5 mg total) by mouth at bedtime. 03/31/15   Kerrie Buffalo, NP  lamoTRIgine (LAMICTAL) 25 MG tablet Take 1 tablet (25 mg total) by mouth at bedtime. 03/31/15    Kerrie Buffalo, NP  nicotine (NICODERM CQ - DOSED IN MG/24 HOURS) 21 mg/24hr patch Place 1 patch (21 mg total) onto the skin daily. 08/21/15   Olivia Canter Sam, PA-C  traZODone (DESYREL) 150 MG tablet Take 1 tablet (150 mg total) by mouth at bedtime. 03/31/15   Kerrie Buffalo, NP   BP 104/69 mmHg  Pulse 73  Temp(Src) 98.3 F (36.8 C)  Resp 17  Ht 6' (1.829 m)  Wt 240 lb (108.863 kg)  BMI 32.54 kg/m2  SpO2 96% Physical Exam  Constitutional: He is oriented to person, place, and time. He appears well-developed and well-nourished.  HENT:  Head: Normocephalic and atraumatic.  Eyes: Conjunctivae and EOM are normal. Pupils are equal, round, and reactive to light.  Neck: Normal range of motion. Neck supple.  Cardiovascular: Normal rate and regular rhythm.   Pulmonary/Chest: Effort normal and breath sounds normal.  Abdominal: Soft. Bowel sounds are normal.  Musculoskeletal: Normal range of motion.  Neurological: He is alert and oriented to person, place, and time.  Skin: Skin is warm and dry.  Psychiatric: He has a normal mood and affect. His behavior is normal.  Nursing note and vitals reviewed.   ED Course  Procedures (including critical care time) Labs Review Labs Reviewed  I-STAT CHEM 8, ED - Abnormal; Notable for the following:    Chloride 100 (*)    Creatinine, Ser  1.40 (*)    Calcium, Ion 1.07 (*)    All other components within normal limits  I-STAT TROPOININ, ED    Imaging Review No results found. I have personally reviewed and evaluated these images and lab results as part of my medical decision-making.   EKG Interpretation   Date/Time:  Friday September 15 2015 13:02:42 EDT Ventricular Rate:  86 PR Interval:  169 QRS Duration: 87 QT Interval:  380 QTC Calculation: [id number] R Axis:   -42 Text Interpretation:  Sinus rhythm Left axis deviation Confirmed by Makynlie Rossini   MD, Jakhi Dishman ([id number]) on 09/15/2015 1:39:33 PM      MDM   Final diagnoses:  Syncope, unspecified syncope type     Patient is in no acute distress. He is alert and oriented. Suspect symptoms related to consumption of Fioricet sent alcohol. Normal physical exam at discharge.   Nat Christen, MD 09/15/15 [phone]

## 2015-09-15 NOTE — ED Notes (Signed)
Pt c/o scratchy throat and cough also.

## 2015-09-15 NOTE — ED Notes (Signed)
Ambulated patient in the hallway without difficulty. Pt reports he feels fine and denies dizziness.

## 2015-09-15 NOTE — Discharge Instructions (Signed)
Tests were good. Drink plenty of fluids. Avoid taking medication that you are not prescribed.

## 2015-09-25 ENCOUNTER — Telehealth: Payer: Self-pay | Admitting: *Deleted

## 2015-09-25 NOTE — Telephone Encounter (Signed)
Patient notified of appt for ultrasound at [place] at [facility] on 10/02/15 at 9:45 AM. He is aware nothing to eat or drink after midnight.

## 2015-10-02 ENCOUNTER — Ambulatory Visit
Admission: RE | Admit: 2015-10-02 | Discharge: 2015-10-02 | Disposition: A | Discharge status: Home / Self Care | Payer: Medicaid Other | Source: Ambulatory Visit | Attending: Internal Medicine | Admitting: Internal Medicine

## 2015-10-02 DIAGNOSIS — K7460 Unspecified cirrhosis of liver: Secondary | ICD-10-CM

## 2015-10-12 ENCOUNTER — Other Ambulatory Visit: Payer: Self-pay | Admitting: Internal Medicine

## 2015-10-12 ENCOUNTER — Telehealth: Payer: Self-pay | Admitting: *Deleted

## 2015-10-12 DIAGNOSIS — K7460 Unspecified cirrhosis of liver: Secondary | ICD-10-CM

## 2015-10-12 NOTE — Telephone Encounter (Signed)
Patient notified of appt for MRI on 10/20/15 at 7:45 AM at [facility]. The test is at 8:45, however he needs to arrive an hour early because he needs a recent creatinine. Offered him to come to our lab beforehand, but due to transportation issues he wanted it on the same day. The authorization # is [id number]. Myrtis Hopping

## 2015-10-12 NOTE — Telephone Encounter (Signed)
-----   Message from Thayer Headings, MD sent at 10/12/2015  8:26 AM EDT ----- Can you please schedule a follow up MRI (ordered) for his recent ultrasound.  There was an abnormality on the gall bladder and just need to look better.  Doesn't seem to be anything significant but just to be sure.  Also have him scheduled with me after that. thanks

## 2015-10-20 ENCOUNTER — Ambulatory Visit (HOSPITAL_COMMUNITY)
Admission: RE | Admit: 2015-10-20 | Discharge: 2015-10-20 | Disposition: A | Discharge status: Home / Self Care | Payer: Medicaid Other | Source: Ambulatory Visit | Attending: Internal Medicine | Admitting: Internal Medicine

## 2015-10-20 DIAGNOSIS — K7460 Unspecified cirrhosis of liver: Secondary | ICD-10-CM | POA: Diagnosis present

## 2015-10-20 DIAGNOSIS — D3502 Benign neoplasm of left adrenal gland: Secondary | ICD-10-CM | POA: Diagnosis not present

## 2015-10-20 DIAGNOSIS — D3501 Benign neoplasm of right adrenal gland: Secondary | ICD-10-CM | POA: Diagnosis not present

## 2015-10-20 MED ORDER — GADOBENATE DIMEGLUMINE 529 MG/ML IV SOLN
20.0000 mL | Freq: Once | INTRAVENOUS | Status: AC | PRN
  Administered 2015-10-20: 20 mL via INTRAVENOUS

## 2016-01-12 ENCOUNTER — Encounter (HOSPITAL_COMMUNITY): Payer: Self-pay | Admitting: Emergency Medicine

## 2016-01-12 ENCOUNTER — Ambulatory Visit (HOSPITAL_COMMUNITY)
Admission: EM | Admit: 2016-01-12 | Discharge: 2016-01-12 | Discharge status: Against Medical Advice | Payer: Medicaid Other | Attending: Emergency Medicine | Admitting: Emergency Medicine

## 2016-01-12 NOTE — ED Triage Notes (Signed)
The patient presented to the [facility] with a complaint of a productive cough and chest congestion x 10 days.

## 2016-01-14 ENCOUNTER — Encounter (HOSPITAL_COMMUNITY): Payer: Self-pay | Admitting: Nurse Practitioner

## 2016-01-14 ENCOUNTER — Emergency Department (HOSPITAL_COMMUNITY): Payer: Medicaid Other

## 2016-01-14 ENCOUNTER — Emergency Department (HOSPITAL_COMMUNITY)
Admission: EM | Admit: 2016-01-14 | Discharge: 2016-01-15 | Disposition: A | Discharge status: Home / Self Care | Payer: Medicaid Other | Attending: Emergency Medicine | Admitting: Emergency Medicine

## 2016-01-14 DIAGNOSIS — Z79899 Other long term (current) drug therapy: Secondary | ICD-10-CM | POA: Insufficient documentation

## 2016-01-14 DIAGNOSIS — J441 Chronic obstructive pulmonary disease with (acute) exacerbation: Secondary | ICD-10-CM

## 2016-01-14 DIAGNOSIS — R062 Wheezing: Secondary | ICD-10-CM

## 2016-01-14 DIAGNOSIS — F1290 Cannabis use, unspecified, uncomplicated: Secondary | ICD-10-CM | POA: Insufficient documentation

## 2016-01-14 DIAGNOSIS — F17210 Nicotine dependence, cigarettes, uncomplicated: Secondary | ICD-10-CM | POA: Insufficient documentation

## 2016-01-14 DIAGNOSIS — J45909 Unspecified asthma, uncomplicated: Secondary | ICD-10-CM | POA: Diagnosis not present

## 2016-01-14 MED ORDER — ALBUTEROL SULFATE (2.5 MG/3ML) 0.083% IN NEBU
5.0000 mg | INHALATION_SOLUTION | Freq: Once | RESPIRATORY_TRACT | Status: AC
  Administered 2016-01-14: 5 mg via RESPIRATORY_TRACT
  Filled 2016-01-14: qty 6

## 2016-01-14 NOTE — ED Triage Notes (Signed)
Pt reports recurrent cough and nasal congestion, obvious wheezing, reports multiple breathing treatments without getting relief.

## 2016-01-15 MED ORDER — ALBUTEROL SULFATE HFA 108 (90 BASE) MCG/ACT IN AERS
2.0000 | INHALATION_SPRAY | RESPIRATORY_TRACT | Status: DC | PRN
  Administered 2016-01-15: 2 via RESPIRATORY_TRACT
  Filled 2016-01-15: qty 6.7

## 2016-01-15 MED ORDER — PREDNISONE 20 MG PO TABS
60.0000 mg | ORAL_TABLET | Freq: Once | ORAL | Status: AC
  Administered 2016-01-15: 60 mg via ORAL
  Filled 2016-01-15: qty 3

## 2016-01-15 MED ORDER — ALBUTEROL SULFATE (2.5 MG/3ML) 0.083% IN NEBU
2.5000 mg | INHALATION_SOLUTION | Freq: Once | RESPIRATORY_TRACT | Status: AC
  Administered 2016-01-15: 2.5 mg via RESPIRATORY_TRACT
  Filled 2016-01-15: qty 3

## 2016-01-15 MED ORDER — AEROCHAMBER PLUS FLO-VU LARGE MISC
1.0000 | Status: AC
  Administered 2016-01-15: 1
  Filled 2016-01-15: qty 1

## 2016-01-15 MED ORDER — PREDNISONE 10 MG PO TABS: 20.0000 mg | ORAL_TABLET | Freq: Every day | ORAL | 0 refills | Status: DC

## 2016-01-15 MED ORDER — ALBUTEROL SULFATE (2.5 MG/3ML) 0.083% IN NEBU
INHALATION_SOLUTION | RESPIRATORY_TRACT | Status: DC
Start: 2016-01-15 — End: 2016-01-15
  Filled 2016-01-15: qty 3

## 2016-01-15 MED ORDER — ALBUTEROL (5 MG/ML) CONTINUOUS INHALATION SOLN
10.0000 mg/h | INHALATION_SOLUTION | RESPIRATORY_TRACT | Status: DC
  Administered 2016-01-15: 10 mg/h via RESPIRATORY_TRACT
  Filled 2016-01-15: qty 20

## 2016-01-15 NOTE — ED Notes (Signed)
No respiratory or acute distress noted alert and oriented x 3 able to speak in full sentences no reaction to medication noted family at bedside call light in reach.

## 2016-01-15 NOTE — ED Provider Notes (Signed)
[facility] DEPT Provider Note   CSN: [id number] Arrival date & time: 01/14/16  2119  First Provider Contact:  None    By signing my name below, I, Victor Rowe, attest that this documentation has been prepared under the direction and in the presence of Victor Creamer, NP.  Electronically Signed: Julien Rowe, ED Scribe. 01/15/16. 12:08 AM.    History   Chief Complaint Chief Complaint  Patient presents with  . Wheezing  . Cough  . Nasal Congestion    The history is provided by the patient. No language interpreter was used.   HPI Comments: Victor Rowe is a 55 y.o. male who has a PMHx of asthma and hepatitis C presents to the Emergency Department complaining of constant, gradual worsening, moderate nasal congestion onset a few days ago. He notes associated sore throat, chest tightness, wheezing, subjective fever, and recurrent cough. Pt states that he does not always use his inhaler to help with his wheezing but wife states she used her nebulizer to help with his symptoms with mild relief. He denies any other complaints. Past Medical History:  Diagnosis Date  . Anxiety   . Asthma   . Depression   . Hepatitis    Hep C  Pt states he has taken a 12 week medication for disease    Patient Active Problem List   Diagnosis Date Noted  . Cannabis use disorder, mild, abuse 03/29/2015  . Schizoaffective disorder, bipolar type ([place]) 03/29/2015  . Alcohol use disorder, moderate, dependence ([place]) 03/29/2015  . Hepatic cirrhosis ([place]) 01/24/2015  . Chronic hepatitis C without hepatic coma ([place]) 11/02/2014    History reviewed. No pertinent surgical history.     Home Medications    Prior to Admission medications   Medication Sig Start Date End Date Taking? Authorizing Provider  ARIPiprazole (ABILIFY) 5 MG tablet Take 1 tablet (5 mg total) by mouth at bedtime. 03/31/15  Yes Victor Buffalo, NP  benztropine (COGENTIN) 0.5 MG tablet Take 1 tablet (0.5 mg total) by mouth at bedtime.  03/31/15  Yes Victor Buffalo, NP  lamoTRIgine (LAMICTAL) 25 MG tablet Take 1 tablet (25 mg total) by mouth at bedtime. 03/31/15  Yes Victor Buffalo, NP  traZODone (DESYREL) 150 MG tablet Take 1 tablet (150 mg total) by mouth at bedtime. 03/31/15  Yes Victor Buffalo, NP  nicotine (NICODERM CQ - DOSED IN MG/24 HOURS) 21 mg/24hr patch Place 1 patch (21 mg total) onto the skin daily. Patient not taking: Reported on 01/15/2016 08/21/15   Victor Canter Sam, PA-C  predniSONE (DELTASONE) 10 MG tablet Take 2 tablets (20 mg total) by mouth daily. 01/15/16   Victor Creamer, NP    Family History Family History  Problem Relation Age of Onset  . Alcoholism Father     Social History Social History  Substance Use Topics  . Smoking status: Current Every Day Smoker    Packs/day: 0.00    Types: Cigarettes  . Smokeless tobacco: Never Used     Comment: "future goal, not right now"  . Alcohol use Yes     Comment: 16 --32 ounce can x 2  on weekends     Allergies   Review of patient's allergies indicates no known allergies.   Review of Systems Review of Systems  Constitutional: Positive for fever.  HENT: Positive for sore throat.   Respiratory: Positive for cough and wheezing.   Cardiovascular: Positive for chest pain.  All other systems reviewed and are negative.    Physical Exam Updated Vital  Signs BP 118/81 (BP Location: Left Arm)   Pulse 113   Temp 98.4 F (36.9 C) (Oral)   Resp 20   SpO2 97%   Physical Exam  Constitutional: He is oriented to person, place, and time. He appears well-developed and well-nourished.  HENT:  Head: Normocephalic.  Eyes: EOM are normal.  Neck: Normal range of motion.  Cardiovascular: Normal rate, regular rhythm and normal heart sounds.   Pulmonary/Chest: Effort normal. He has wheezes.  High pitch expiratory wheezing  Abdominal: He exhibits no distension.  Musculoskeletal: Normal range of motion.  Neurological: He is alert and oriented to person, place, and  time.  Psychiatric: He has a normal mood and affect.  Nursing note and vitals reviewed.    ED Treatments / Results  DIAGNOSTIC STUDIES: Oxygen Saturation is 91% on RA, low by my interpretation.  COORDINATION OF CARE:  12:04 AM Discussed treatment plan with pt at bedside and pt agreed to plan.  Labs (all labs ordered are listed, but only abnormal results are displayed) Labs Reviewed - No data to display  EKG  EKG Interpretation None       Radiology Dg Chest 2 View  Result Date: 01/14/2016 CLINICAL DATA:  Shortness of breath. Productive cough, chest and nasal congestion. Wheezing for 2 weeks. EXAM: CHEST  2 VIEW COMPARISON:  Radiograph 08/21/2015 most recent chest CT 08/29/2014 FINDINGS: Emphysema and bronchial wall thickening. Degree bronchial thickening has increased from prior exam. No confluent airspace disease. Heart size and mediastinal contours are normal. Remote granulomatous disease in the right midlung with small calcified nodules. No pleural effusion or pneumothorax. No pulmonary edema. Unchanged osseous structures. IMPRESSION: 1. Increased bronchial wall thickening suggesting acute on chronic bronchitis. 2. Emphysema. Electronically Signed   By: Victor Rowe M.D.   On: 01/14/2016 22:08   Procedures Procedures (including critical care time)  Medications Ordered in ED Medications  albuterol (PROVENTIL,VENTOLIN) solution continuous neb (10 mg/hr Nebulization New Bag/Given 01/15/16 0042)  albuterol (PROVENTIL) (2.5 MG/3ML) 0.083% nebulizer solution (  Not Given 01/15/16 0055)  albuterol (PROVENTIL HFA;VENTOLIN HFA) 108 (90 Base) MCG/ACT inhaler 2 puff (not administered)  albuterol (PROVENTIL) (2.5 MG/3ML) 0.083% nebulizer solution 5 mg (5 mg Nebulization Given 01/14/16 2153)  predniSONE (DELTASONE) tablet 60 mg (60 mg Oral Given 01/15/16 0016)  albuterol (PROVENTIL) (2.5 MG/3ML) 0.083% nebulizer solution 2.5 mg (2.5 mg Nebulization Given 01/15/16 0024)     Initial  Impression / Assessment and Plan / ED Course  I have reviewed the triage vital signs and the nursing notes.  Pertinent labs & imaging results that were available during my care of the patient were reviewed by me and considered in my medical decision making (see chart for details).  Clinical Course    Patient's x-ray shows that he has new onset emphysema/COPD he has been started on prednisone.  He did receive a DuoNeb and a continuous albuterol treatment in the emergency room with resolution of his symptoms.  He has been instructed to make an appointment with his primary care physician for follow-up in the next 2-3 weeks.  He also has been supplied with an inhaler with instructions to use it 2 puffs every 4-6 hours while awake for 2 days then as needed thereafter  Final Clinical Impressions(s) / ED Diagnoses   Final diagnoses:  COPD exacerbation ([place])  Wheezing   I personally performed the services described in this documentation, which was scribed in my presence. The recorded information has been reviewed and is accurate. New Prescriptions New  Prescriptions   PREDNISONE (DELTASONE) 10 MG TABLET    Take 2 tablets (20 mg total) by mouth daily.     Victor Creamer, NP 01/15/16 [place], MD 01/15/16 [phone]

## 2016-01-15 NOTE — Discharge Instructions (Signed)
You have been given an inhaler in the emergency department.  Please uses as follows 2 puffs every 4-6 hours while awake for 2 days then as needed.  You've also been given a prescription for prednisone.  Please use this as directed until all tablets have been taken.  Please call your primary care physician for an appointment to be seen in the next 2-3 weeks for follow-up

## 2016-01-15 NOTE — ED Notes (Signed)
wheezing noted on entering room able to speak in short sentences visitors at bedside call light in reach.

## 2016-01-17 ENCOUNTER — Emergency Department (HOSPITAL_COMMUNITY): Payer: Medicaid Other

## 2016-01-17 ENCOUNTER — Encounter (HOSPITAL_COMMUNITY): Payer: Self-pay | Admitting: Emergency Medicine

## 2016-01-17 ENCOUNTER — Emergency Department (HOSPITAL_COMMUNITY)
Admission: EM | Admit: 2016-01-17 | Discharge: 2016-01-17 | Disposition: A | Discharge status: Home / Self Care | Payer: Medicaid Other | Source: Home / Self Care | Attending: Emergency Medicine | Admitting: Emergency Medicine

## 2016-01-17 DIAGNOSIS — J40 Bronchitis, not specified as acute or chronic: Secondary | ICD-10-CM

## 2016-01-17 DIAGNOSIS — R062 Wheezing: Secondary | ICD-10-CM

## 2016-01-17 DIAGNOSIS — F17210 Nicotine dependence, cigarettes, uncomplicated: Secondary | ICD-10-CM

## 2016-01-17 MED ORDER — ALBUTEROL SULFATE (2.5 MG/3ML) 0.083% IN NEBU
5.0000 mg | INHALATION_SOLUTION | Freq: Once | RESPIRATORY_TRACT | Status: AC
  Administered 2016-01-17: 5 mg via RESPIRATORY_TRACT
  Filled 2016-01-17: qty 6

## 2016-01-17 MED ORDER — ALBUTEROL SULFATE (2.5 MG/3ML) 0.083% IN NEBU
INHALATION_SOLUTION | RESPIRATORY_TRACT | Status: AC
  Filled 2016-01-17: qty 6

## 2016-01-17 MED ORDER — IPRATROPIUM BROMIDE 0.02 % IN SOLN
0.5000 mg | Freq: Once | RESPIRATORY_TRACT | Status: AC
  Administered 2016-01-17: 0.5 mg via RESPIRATORY_TRACT
  Filled 2016-01-17: qty 2.5

## 2016-01-17 MED ORDER — AZITHROMYCIN 250 MG PO TABS
250.0000 mg | ORAL_TABLET | Freq: Every day | ORAL | 0 refills | Status: DC
Start: 2016-01-17 — End: 2016-01-19

## 2016-01-17 MED ORDER — ALBUTEROL SULFATE (2.5 MG/3ML) 0.083% IN NEBU
5.0000 mg | INHALATION_SOLUTION | Freq: Once | RESPIRATORY_TRACT | Status: AC
  Administered 2016-01-17: 5 mg via RESPIRATORY_TRACT

## 2016-01-17 MED ORDER — AZITHROMYCIN 250 MG PO TABS
500.0000 mg | ORAL_TABLET | Freq: Once | ORAL | Status: AC
  Administered 2016-01-17: 500 mg via ORAL
  Filled 2016-01-17: qty 2

## 2016-01-17 NOTE — ED Triage Notes (Signed)
Pt. reports wheezing and productive cough/chest congestion  onset last week unrelieved by MDI , denies fever or chills.

## 2016-01-17 NOTE — ED Provider Notes (Addendum)
[facility] DEPT Provider Note   CSN: [id number] Arrival date & time: 01/17/16  [id number]  First Provider Contact:  First MD Initiated Contact with Patient 01/17/16 [phone]        History   Chief Complaint Chief Complaint  Patient presents with  . Wheezing  . Cough    HPI Victor Rowe is a 55 y.o. male.  Patient is a 55 year old male with a history of asthma, hepatitis, alcohol abuse presenting with persistent shortness of breath and wheezing for the last 2 weeks after he quit smoking. He denies any fever and the cough is intermittently productive of green sputum. The cough is always worse when he tries to lay down at night. He was seen several days ago at the emergency room and given an inhaler and a prescription for prednisone. He been using the head inhaler with only minimal improvement but started the prednisone this morning.   The history is provided by the patient.  Shortness of Breath  This is a new problem. The average episode lasts 2 weeks. The problem occurs continuously.The problem has not changed since onset.Associated symptoms include cough and sputum production. Pertinent negatives include no fever, no rhinorrhea, no sore throat, no orthopnea, no chest pain, no vomiting, no abdominal pain, no leg pain and no leg swelling. Precipitated by: Stopped smoking 2 weeks ago that's when all of his symptoms started. Risk factors include smoking. He has tried beta-agonist inhalers for the symptoms. The treatment provided no relief. He has had no prior hospitalizations. He has had prior ED visits. Associated medical issues include asthma. Associated medical issues do not include CAD or recent surgery.    Past Medical History:  Diagnosis Date  . Anxiety   . Asthma   . Depression   . Hepatitis    Hep C  Pt states he has taken a 12 week medication for disease    Patient Active Problem List   Diagnosis Date Noted  . Cannabis use disorder, mild, abuse 03/29/2015  . Schizoaffective  disorder, bipolar type ([place]) 03/29/2015  . Alcohol use disorder, moderate, dependence ([place]) 03/29/2015  . Hepatic cirrhosis ([place]) 01/24/2015  . Chronic hepatitis C without hepatic coma ([place]) 11/02/2014    History reviewed. No pertinent surgical history.     Home Medications    Prior to Admission medications   Medication Sig Start Date End Date Taking? Authorizing Provider  albuterol (PROVENTIL HFA;VENTOLIN HFA) 108 (90 Base) MCG/ACT inhaler Inhale 1-2 puffs into the lungs every 6 (six) hours as needed for wheezing or shortness of breath.   Yes Historical Provider, MD  ARIPiprazole (ABILIFY) 5 MG tablet Take 1 tablet (5 mg total) by mouth at bedtime. 03/31/15  Yes Kerrie Buffalo, NP  benztropine (COGENTIN) 0.5 MG tablet Take 1 tablet (0.5 mg total) by mouth at bedtime. 03/31/15  Yes Kerrie Buffalo, NP  lamoTRIgine (LAMICTAL) 25 MG tablet Take 1 tablet (25 mg total) by mouth at bedtime. 03/31/15  Yes Kerrie Buffalo, NP  predniSONE (DELTASONE) 10 MG tablet Take 2 tablets (20 mg total) by mouth daily. 01/15/16  Yes [name], NP  traZODone (DESYREL) 150 MG tablet Take 1 tablet (150 mg total) by mouth at bedtime. 03/31/15  Yes Kerrie Buffalo, NP  nicotine (NICODERM CQ - DOSED IN MG/24 HOURS) 21 mg/24hr patch Place 1 patch (21 mg total) onto the skin daily. Patient not taking: Reported on 01/15/2016 08/21/15   Anne Ng, PA-C    Family History Family History  Problem Relation Age of Onset  .  Alcoholism Father     Social History Social History  Substance Use Topics  . Smoking status: Current Every Day Smoker    Packs/day: 0.00    Types: Cigarettes  . Smokeless tobacco: Never Used     Comment: "future goal, not right now"  . Alcohol use Yes     Allergies   Review of patient's allergies indicates no known allergies.   Review of Systems Review of Systems  Constitutional: Negative for fever.  HENT: Negative for rhinorrhea and sore throat.   Respiratory: Positive for cough,  sputum production and shortness of breath.   Cardiovascular: Negative for chest pain, orthopnea and leg swelling.  Gastrointestinal: Negative for abdominal pain and vomiting.  All other systems reviewed and are negative.    Physical Exam Updated Vital Signs BP 122/89   Pulse 82   Temp 97.7 F (36.5 C) (Oral)   Resp 19   Ht 5\' 11"  (1.803 m)   Wt 257 lb (116.6 kg)   SpO2 93%   BMI 35.84 kg/m   Physical Exam  Constitutional: He is oriented to person, place, and time. He appears well-developed and well-nourished. No distress.  HENT:  Head: Normocephalic and atraumatic.  Mouth/Throat: Oropharynx is clear and moist.  Eyes: Conjunctivae and EOM are normal. Pupils are equal, round, and reactive to light.  Neck: Normal range of motion. Neck supple.  Cardiovascular: Normal rate, regular rhythm and intact distal pulses.   No murmur heard. Pulmonary/Chest: Effort normal. No respiratory distress. He has wheezes. He has no rales.  Abdominal: Soft. He exhibits no distension. There is no tenderness. There is no rebound and no guarding.  Musculoskeletal: Normal range of motion. He exhibits no edema or tenderness.  Neurological: He is alert and oriented to person, place, and time.  Skin: Skin is warm and dry. No rash noted. No erythema.  Psychiatric: He has a normal mood and affect. His behavior is normal.  Nursing note and vitals reviewed.    ED Treatments / Results  Labs (all labs ordered are listed, but only abnormal results are displayed) Labs Reviewed - No data to display  EKG  EKG Interpretation None       Radiology Dg Chest 2 View  Result Date: 01/17/2016 CLINICAL DATA:  55 year old male with cough and wheezing and congestion EXAM: CHEST  2 VIEW COMPARISON:  Chest radiograph dated 01/14/2016 FINDINGS: The heart size and mediastinal contours are within normal limits. Both lungs are clear. The visualized skeletal structures are unremarkable. IMPRESSION: No active  cardiopulmonary disease. Electronically Signed   By: Anner Crete M.D.   On: 01/17/2016 05:47    Procedures Procedures (including critical care time)  Medications Ordered in ED Medications  azithromycin (ZITHROMAX) tablet 500 mg (not administered)  albuterol (PROVENTIL) (2.5 MG/3ML) 0.083% nebulizer solution 5 mg (not administered)  ipratropium (ATROVENT) nebulizer solution 0.5 mg (not administered)  albuterol (PROVENTIL) (2.5 MG/3ML) 0.083% nebulizer solution 5 mg (5 mg Nebulization Given 01/17/16 0512)     Initial Impression / Assessment and Plan / ED Course  I have reviewed the triage vital signs and the nursing notes.  Pertinent labs & imaging results that were available during my care of the patient were reviewed by me and considered in my medical decision making (see chart for details).  Clinical Course   Patient is a 55 year old male with past smoking history for the last 2 weeks who's had worsening cough, wheezing and congestion. He was seen several days ago and given an inhaler and  a prescription for prednisone. He's been using the inhaler at home without much improvement. He started the prednisone this morning. He denies any fever and the cough is intermittently productive. On exam he has wheezing but it's much improved after albuterol and Atrovent. Patient given a second neb and will also cover for bronchitis with azithromycin. Encouraged patient to continue the prednisone. He is not displaying any signs of tachypnea at this time O2 sats are greater than 90%. He is able to walk without difficulty. He has no chest pain, swelling or concerns for ACS or CHF.  Final Clinical Impressions(s) / ED Diagnoses   Final diagnoses:  Bronchitis  Wheezing    New Prescriptions New Prescriptions   No medications on file     Blanchie Dessert, MD 01/17/16 [id number]    Blanchie Dessert, MD 01/17/16 0945

## 2016-01-18 ENCOUNTER — Emergency Department (HOSPITAL_COMMUNITY): Payer: Medicaid Other

## 2016-01-18 ENCOUNTER — Encounter (HOSPITAL_COMMUNITY): Payer: Self-pay | Admitting: *Deleted

## 2016-01-18 ENCOUNTER — Inpatient Hospital Stay (HOSPITAL_COMMUNITY)
Admission: EM | Admit: 2016-01-18 | Discharge: 2016-01-19 | DRG: 192 | Disposition: A | Discharge status: Home / Self Care | Payer: Medicaid Other | Attending: Internal Medicine | Admitting: Internal Medicine

## 2016-01-18 DIAGNOSIS — J441 Chronic obstructive pulmonary disease with (acute) exacerbation: Secondary | ICD-10-CM

## 2016-01-18 DIAGNOSIS — R0602 Shortness of breath: Secondary | ICD-10-CM | POA: Diagnosis present

## 2016-01-18 DIAGNOSIS — J209 Acute bronchitis, unspecified: Secondary | ICD-10-CM | POA: Diagnosis present

## 2016-01-18 DIAGNOSIS — J440 Chronic obstructive pulmonary disease with acute lower respiratory infection: Secondary | ICD-10-CM | POA: Diagnosis present

## 2016-01-18 DIAGNOSIS — F250 Schizoaffective disorder, bipolar type: Secondary | ICD-10-CM | POA: Diagnosis present

## 2016-01-18 DIAGNOSIS — Z811 Family history of alcohol abuse and dependence: Secondary | ICD-10-CM | POA: Diagnosis not present

## 2016-01-18 DIAGNOSIS — R0603 Acute respiratory distress: Secondary | ICD-10-CM

## 2016-01-18 DIAGNOSIS — B1920 Unspecified viral hepatitis C without hepatic coma: Secondary | ICD-10-CM | POA: Diagnosis present

## 2016-01-18 DIAGNOSIS — J9601 Acute respiratory failure with hypoxia: Secondary | ICD-10-CM

## 2016-01-18 DIAGNOSIS — Z87891 Personal history of nicotine dependence: Secondary | ICD-10-CM | POA: Diagnosis not present

## 2016-01-18 HISTORY — DX: Chronic obstructive pulmonary disease, unspecified: J44.9

## 2016-01-18 LAB — CBC WITH DIFFERENTIAL/PLATELET
BASOS ABS: 0.1 10*3/uL (ref 0.0–0.1)
BASOS ABS: 0 10*3/uL (ref 0.0–0.1)
BASOS PCT: 0 %
Basophils Relative: 1 %
Eosinophils Absolute: 1 10*3/uL — ABNORMAL HIGH (ref 0.0–0.7)
Eosinophils Absolute: 0.1 10*3/uL (ref 0.0–0.7)
Eosinophils Relative: 9 %
Eosinophils Relative: 0 %
HEMATOCRIT: 43 % (ref 39.0–52.0)
HEMATOCRIT: 41.6 % (ref 39.0–52.0)
HEMOGLOBIN: 14.3 g/dL (ref 13.0–17.0)
HEMOGLOBIN: 13.5 g/dL (ref 13.0–17.0)
LYMPHS PCT: 40 %
LYMPHS PCT: 8 %
Lymphs Abs: 4.6 10*3/uL — ABNORMAL HIGH (ref 0.7–4.0)
Lymphs Abs: 0.9 10*3/uL (ref 0.7–4.0)
MCH: 31 pg (ref 26.0–34.0)
MCH: 30.3 pg (ref 26.0–34.0)
MCHC: 33.3 g/dL (ref 30.0–36.0)
MCHC: 32.5 g/dL (ref 30.0–36.0)
MCV: 93.3 fL (ref 78.0–100.0)
MCV: 93.3 fL (ref 78.0–100.0)
MONO ABS: 0.7 10*3/uL (ref 0.1–1.0)
MONOS PCT: 6 %
MONOS PCT: 1 %
Monocytes Absolute: 0.1 10*3/uL (ref 0.1–1.0)
NEUTROS ABS: 5.1 10*3/uL (ref 1.7–7.7)
NEUTROS ABS: 10.2 10*3/uL — AB (ref 1.7–7.7)
NEUTROS PCT: 44 %
NEUTROS PCT: 91 %
Platelets: 177 10*3/uL (ref 150–400)
Platelets: 179 10*3/uL (ref 150–400)
RBC: 4.61 MIL/uL (ref 4.22–5.81)
RBC: 4.46 MIL/uL (ref 4.22–5.81)
RDW: 14.8 % (ref 11.5–15.5)
RDW: 15 % (ref 11.5–15.5)
WBC: 11.4 10*3/uL — ABNORMAL HIGH (ref 4.0–10.5)
WBC: 11.3 10*3/uL — ABNORMAL HIGH (ref 4.0–10.5)

## 2016-01-18 LAB — COMPREHENSIVE METABOLIC PANEL
ALBUMIN: 4.4 g/dL (ref 3.5–5.0)
ALK PHOS: 48 U/L (ref 38–126)
ALT: 52 U/L (ref 17–63)
AST: 103 U/L — AB (ref 15–41)
Anion gap: 12 (ref 5–15)
BILIRUBIN TOTAL: 0.5 mg/dL (ref 0.3–1.2)
BUN: 11 mg/dL (ref 6–20)
CALCIUM: 9.1 mg/dL (ref 8.9–10.3)
CO2: 23 mmol/L (ref 22–32)
Chloride: 106 mmol/L (ref 101–111)
Creatinine, Ser: 1.28 mg/dL — ABNORMAL HIGH (ref 0.61–1.24)
GFR calc Af Amer: >60 mL/min (ref >60)
GFR calc non Af Amer: >60 mL/min (ref >60)
GLUCOSE: 186 mg/dL — AB (ref 65–99)
Potassium: 3.2 mmol/L — ABNORMAL LOW (ref 3.5–5.1)
SODIUM: 141 mmol/L (ref 135–145)
Total Protein: 7.5 g/dL (ref 6.5–8.1)

## 2016-01-18 LAB — I-STAT CHEM 8, ED
BUN: 12 mg/dL (ref 6–20)
CREATININE: 1 mg/dL (ref 0.61–1.24)
Calcium, Ion: 1.1 mmol/L — ABNORMAL LOW (ref 1.13–1.30)
Chloride: 104 mmol/L (ref 101–111)
GLUCOSE: 113 mg/dL — AB (ref 65–99)
HEMATOCRIT: 43 % (ref 39.0–52.0)
HEMOGLOBIN: 14.6 g/dL (ref 13.0–17.0)
POTASSIUM: 3.8 mmol/L (ref 3.5–5.1)
Sodium: 143 mmol/L (ref 135–145)
TCO2: 28 mmol/L (ref 0–100)

## 2016-01-18 LAB — TSH: TSH: 0.483 u[IU]/mL (ref 0.350–4.500)

## 2016-01-18 MED ORDER — ONDANSETRON HCL 4 MG PO TABS: 4.0000 mg | ORAL_TABLET | Freq: Four times a day (QID) | ORAL | Status: DC | PRN

## 2016-01-18 MED ORDER — ARIPIPRAZOLE 5 MG PO TABS
5.0000 mg | ORAL_TABLET | Freq: Every day | ORAL | Status: DC
  Administered 2016-01-18: 5 mg via ORAL
  Filled 2016-01-18: qty 1

## 2016-01-18 MED ORDER — ACETAMINOPHEN 650 MG RE SUPP
650.0000 mg | Freq: Four times a day (QID) | RECTAL | Status: DC | PRN
Start: 2016-01-18 — End: 2016-01-19

## 2016-01-18 MED ORDER — ACETAMINOPHEN 325 MG PO TABS: 650.0000 mg | ORAL_TABLET | Freq: Four times a day (QID) | ORAL | Status: DC | PRN

## 2016-01-18 MED ORDER — BUDESONIDE 0.25 MG/2ML IN SUSP
0.2500 mg | Freq: Two times a day (BID) | RESPIRATORY_TRACT | Status: DC
Start: 2016-01-18 — End: 2016-01-19
  Administered 2016-01-18 – 2016-01-19 (×3): 0.25 mg via RESPIRATORY_TRACT
  Filled 2016-01-18 (×3): qty 2

## 2016-01-18 MED ORDER — ALBUTEROL (5 MG/ML) CONTINUOUS INHALATION SOLN: 10.0000 mg/h | INHALATION_SOLUTION | Freq: Once | RESPIRATORY_TRACT | Status: DC

## 2016-01-18 MED ORDER — ONDANSETRON HCL 4 MG/2ML IJ SOLN: 4.0000 mg | Freq: Four times a day (QID) | INTRAMUSCULAR | Status: DC | PRN

## 2016-01-18 MED ORDER — IPRATROPIUM BROMIDE 0.02 % IN SOLN: 0.5000 mg | Freq: Once | RESPIRATORY_TRACT | Status: DC

## 2016-01-18 MED ORDER — TRAZODONE HCL 50 MG PO TABS
150.0000 mg | ORAL_TABLET | Freq: Every day | ORAL | Status: DC
  Administered 2016-01-18: 150 mg via ORAL
  Filled 2016-01-18: qty 1

## 2016-01-18 MED ORDER — ALBUTEROL (5 MG/ML) CONTINUOUS INHALATION SOLN
10.0000 mg/h | INHALATION_SOLUTION | Freq: Once | RESPIRATORY_TRACT | Status: AC
  Administered 2016-01-18: 10 mg/h via RESPIRATORY_TRACT
  Filled 2016-01-18: qty 20

## 2016-01-18 MED ORDER — METHYLPREDNISOLONE SODIUM SUCC 40 MG IJ SOLR
40.0000 mg | Freq: Every day | INTRAMUSCULAR | Status: DC
  Administered 2016-01-18 – 2016-01-19 (×2): 40 mg via INTRAVENOUS
  Filled 2016-01-18 (×2): qty 1

## 2016-01-18 MED ORDER — PNEUMOCOCCAL VAC POLYVALENT 25 MCG/0.5ML IJ INJ
0.5000 mL | INJECTION | INTRAMUSCULAR | Status: AC
  Administered 2016-01-19: 0.5 mL via INTRAMUSCULAR
  Filled 2016-01-18: qty 0.5

## 2016-01-18 MED ORDER — IPRATROPIUM-ALBUTEROL 0.5-2.5 (3) MG/3ML IN SOLN
3.0000 mL | RESPIRATORY_TRACT | Status: DC | PRN
  Filled 2016-01-18: qty 42

## 2016-01-18 MED ORDER — DOXYCYCLINE HYCLATE 100 MG IV SOLR
100.0000 mg | Freq: Two times a day (BID) | INTRAVENOUS | Status: DC
  Administered 2016-01-18 – 2016-01-19 (×3): 100 mg via INTRAVENOUS
  Filled 2016-01-18 (×4): qty 100

## 2016-01-18 MED ORDER — BENZTROPINE MESYLATE 1 MG PO TABS
0.5000 mg | ORAL_TABLET | Freq: Every day | ORAL | Status: DC
  Administered 2016-01-18: 0.5 mg via ORAL
  Filled 2016-01-18: qty 1

## 2016-01-18 MED ORDER — SODIUM CHLORIDE 0.9 % IV SOLN
INTRAVENOUS | Status: DC
  Administered 2016-01-18: 02:00:00 via INTRAVENOUS

## 2016-01-18 MED ORDER — LAMOTRIGINE 25 MG PO TABS
25.0000 mg | ORAL_TABLET | Freq: Every day | ORAL | Status: DC
Start: 2016-01-18 — End: 2016-01-19
  Administered 2016-01-18: 25 mg via ORAL
  Filled 2016-01-18: qty 1

## 2016-01-18 MED ORDER — ENOXAPARIN SODIUM 40 MG/0.4ML ~~LOC~~ SOLN
40.0000 mg | SUBCUTANEOUS | Status: DC
  Administered 2016-01-18: 40 mg via SUBCUTANEOUS
  Filled 2016-01-18: qty 0.4

## 2016-01-18 MED ORDER — IPRATROPIUM-ALBUTEROL 0.5-2.5 (3) MG/3ML IN SOLN
3.0000 mL | RESPIRATORY_TRACT | Status: DC
Start: 2016-01-18 — End: 2016-01-19
  Administered 2016-01-18 (×6): 3 mL via RESPIRATORY_TRACT
  Filled 2016-01-18 (×5): qty 3

## 2016-01-18 MED ORDER — POTASSIUM CHLORIDE CRYS ER 20 MEQ PO TBCR
40.0000 meq | EXTENDED_RELEASE_TABLET | Freq: Two times a day (BID) | ORAL | Status: AC
Start: 2016-01-18 — End: 2016-01-18
  Administered 2016-01-18 (×2): 40 meq via ORAL
  Filled 2016-01-18 (×2): qty 2

## 2016-01-18 NOTE — Progress Notes (Signed)
Attempted calling ED for report 6 times on [id number] [phone] [id number]. Calls were unsuccessful with no pick up. Will continue to attempt.

## 2016-01-18 NOTE — ED Provider Notes (Signed)
[facility] DEPT Provider Note   CSN: [id number] Arrival date & time: 01/18/16  0111  First Provider Contact:  First MD Initiated Contact with Patient 01/18/16 0117    By signing my name below, I, Dora Sims, attest that this documentation has been prepared under the direction and in the presence of physician practitioner, Varney Biles, MD. Electronically Signed: Dora Sims, Scribe. 01/18/2016. 1:17 AM.  History   Chief Complaint Chief Complaint  Patient presents with  . Respiratory Distress    The history is provided by the patient and the EMS personnel. No language interpreter was used.     HPI Comments: LEVEL 5 CAVEAT FOR ACUITY OF CONDITION Victor Rowe is a 55 y.o. male brought in by EMS, with PMHx of asthma and COPD, who presents to the Emergency Department complaining of constant, severe, worsening, SOB beginning several hours PTA. Per EMS, pt was prescribed antibiotics today for a recent diagnosis of bronchitis. Pt reports a cough over the last several days. He notes chest pain currently. Pt denies having to use a breathing tube in the past. EMS reports he used his inhaler with no relief of his SOB tonight, so his wife called EMS when his respiratory distress became severe. EMS reports patient was alert and talking upon arrival, but his mental status slowly declined and he began having trouble speaking in complete sentences. Pt was administered magnesium sulfate 2 mg, solumedrol 125 mg, and a nebulizer upon EMS arrival. He denies h/o HTN or MI. He reports drug use many years ago but none currently. He is a former smoker and states he has not smoked in two years. He denies being around sick contacts with a cough recently. He denies having trouble breathing of this severity in the past. He further denies any other complaints or symptoms.  Past Medical History:  Diagnosis Date  . Anxiety   . Asthma   . COPD (chronic obstructive pulmonary disease) ([place])   . Depression   .  Hepatitis    Hep C  Pt states he has taken a 12 week medication for disease    Patient Active Problem List   Diagnosis Date Noted  . COPD with acute exacerbation ([place]) 01/18/2016  . COPD exacerbation ([place]) 01/18/2016  . Acute bronchitis 01/18/2016  . Cannabis use disorder, mild, abuse 03/29/2015  . Schizoaffective disorder, bipolar type ([place]) 03/29/2015  . Alcohol use disorder, moderate, dependence ([place]) 03/29/2015  . Hepatic cirrhosis ([place]) 01/24/2015  . Chronic hepatitis C without hepatic coma ([place]) 11/02/2014    History reviewed. No pertinent surgical history.    Home Medications    Prior to Admission medications   Medication Sig Start Date End Date Taking? Authorizing Provider  ARIPiprazole (ABILIFY) 5 MG tablet Take 1 tablet (5 mg total) by mouth at bedtime. 03/31/15  Yes Kerrie Buffalo, NP  benztropine (COGENTIN) 0.5 MG tablet Take 1 tablet (0.5 mg total) by mouth at bedtime. 03/31/15  Yes Kerrie Buffalo, NP  lamoTRIgine (LAMICTAL) 25 MG tablet Take 1 tablet (25 mg total) by mouth at bedtime. 03/31/15  Yes Kerrie Buffalo, NP  traZODone (DESYREL) 150 MG tablet Take 1 tablet (150 mg total) by mouth at bedtime. 03/31/15  Yes Kerrie Buffalo, NP  albuterol (PROVENTIL HFA;VENTOLIN HFA) 108 (90 Base) MCG/ACT inhaler Inhale 1-2 puffs into the lungs every 6 (six) hours as needed for wheezing or shortness of breath. 01/19/16   Charlynne Cousins, MD  azithromycin (ZITHROMAX) 250 MG tablet Take 1 tablet (250 mg total) by mouth daily.  Take 1 every day until finished. 01/19/16   Charlynne Cousins, MD    Family History Family History  Problem Relation Age of Onset  . Alcoholism Father     Social History Social History  Substance Use Topics  . Smoking status: Former Smoker    Packs/day: 0.00    Types: Cigarettes    Quit date: 12/28/2015  . Smokeless tobacco: Never Used     Comment: "future goal, not right now"  . Alcohol use No     Allergies   Review of patient's allergies  indicates no known allergies.   Review of Systems Review of Systems  Unable to perform ROS: Acuity of condition    A complete 10 system review of systems was obtained and all systems are negative except as noted in the HPI and PMH.   Physical Exam Updated Vital Signs BP 121/70 (BP Location: Right Arm)   Pulse 83   Temp 98.5 F (36.9 C)   Resp 16   Ht 5\' 9"  (1.753 m)   Wt 255 lb 6.4 oz (115.8 kg)   SpO2 99%   BMI 37.72 kg/m   Physical Exam  Constitutional: He is oriented to person, place, and time. He appears well-developed and well-nourished. No distress.  Following commands.  HENT:  Head: Normocephalic and atraumatic.  Eyes: Conjunctivae and EOM are normal.  Neck: Neck supple. No tracheal deviation present.  Cardiovascular: Tachycardia present.   No murmur heard. No JVD.  Pulmonary/Chest: He is in respiratory distress.  Inspiratory and expiratory wheezing, E > I. Patient is having subcostal and abdominal retractions. Chest is tight with poor aeration.  Musculoskeletal: Normal range of motion.  FROM extremities. No pitting edema. No calf tenderness.  Neurological: He is alert and oriented to person, place, and time.  Mental status change.  Skin: Skin is warm and dry.  Skin is warm to the touch.  Psychiatric: He has a normal mood and affect. His behavior is normal.  Nursing note and vitals reviewed.   ED Treatments / Results  Labs (all labs ordered are listed, but only abnormal results are displayed) Labs Reviewed  CBC WITH DIFFERENTIAL/PLATELET - Abnormal; Notable for the following:       Result Value   WBC 11.4 (*)    Lymphs Abs 4.6 (*)    Eosinophils Absolute 1.0 (*)    All other components within normal limits  COMPREHENSIVE METABOLIC PANEL - Abnormal; Notable for the following:    Potassium 3.2 (*)    Glucose, Bld 186 (*)    Creatinine, Ser 1.28 (*)    AST 103 (*)    All other components within normal limits  CBC WITH DIFFERENTIAL/PLATELET -  Abnormal; Notable for the following:    WBC 11.3 (*)    Neutro Abs 10.2 (*)    All other components within normal limits  BASIC METABOLIC PANEL - Abnormal; Notable for the following:    Glucose, Bld 103 (*)    All other components within normal limits  I-STAT CHEM 8, ED - Abnormal; Notable for the following:    Glucose, Bld 113 (*)    Calcium, Ion 1.10 (*)    All other components within normal limits  TSH    EKG  EKG Interpretation  Date/Time:  Thursday January 18 2016 01:18:11 EDT Ventricular Rate:  113 PR Interval:    QRS Duration: 86 QT Interval:  341 QTC Calculation: 466 R Axis:   -78 Text Interpretation:  Sinus tachycardia LAD, consider left anterior  fascicular block Abnormal R-wave progression, late transition When compared with ECG of 09/15/2015, No significant change was found Confirmed by [facility]  MD, DAVID ([id number]) on 01/19/2016 6:02:17 PM       Radiology No results found.  Procedures .Critical Care Performed by: Varney Biles Authorized by: Varney Biles   Critical care provider statement:    Critical care time (minutes):  50   Critical care time was exclusive of:  Separately billable procedures and treating other patients   Critical care was necessary to treat or prevent imminent or life-threatening deterioration of the following conditions:  Respiratory failure   Critical care was time spent personally by me on the following activities:  Blood draw for specimens, development of treatment plan with patient or surrogate, discussions with consultants, examination of patient, evaluation of patient's response to treatment, obtaining history from patient or surrogate, ordering and performing treatments and interventions, ordering and review of laboratory studies, ordering and review of radiographic studies, pulse oximetry and re-evaluation of patient's condition   (including critical care time)  DIAGNOSTIC STUDIES: Oxygen Saturation is 90% on oxygen mask, low by my  interpretation.    COORDINATION OF CARE: 1:17 AM Discussed treatment plan with pt at bedside and pt agreed to plan.   Medications Ordered in ED Medications  albuterol (PROVENTIL,VENTOLIN) solution continuous neb (10 mg/hr Nebulization Given 01/18/16 0129)  pneumococcal 23 valent vaccine (PNU-IMMUNE) injection 0.5 mL (0.5 mLs Intramuscular Given 01/19/16 0920)  potassium chloride SA (K-DUR,KLOR-CON) CR tablet 40 mEq (40 mEq Oral Given 01/18/16 2153)     Initial Impression / Assessment and Plan / ED Course  I have reviewed the triage vital signs and the nursing notes.  Pertinent labs & imaging results that were available during my care of the patient were reviewed by me and considered in my medical decision making (see chart for details).  Clinical Course   2:52 AM reassessment: repeat lung exam performed. Pt is breathing comfortably at this point. Still has wheezing, but lung exam much improved. Will d/c bi-pap.  3:30 AM reassessment: patient's bi-pap has been discontinued, pt on continuous nebulizer treatment. Pt continues to have inspiratory and expiratory wheezing, tachypnea. No significant respiratory distress appreciated anymore. Given the persistent wheezing despite multiple treatments, we will admit pt for COPD exacerbation. Pt denies any active chest pain. He has no h/o PE and denies any active cancer, exogenous hormone treatment, long distance travels or surgeries.  I personally performed the services described in this documentation, which was scribed in my presence. The recorded information has been reviewed and is accurate.   PT comes in with hypoxic resp failure, likely due to copd exacerbation. No clinical signs of fluid overload. Will start nebs,bipap.  Final Clinical Impressions(s) / ED Diagnoses   Final diagnoses:  COPD exacerbation ([place])  Respiratory distress  Acute respiratory failure with hypoxia [facility])    New Prescriptions Discharge Medication List as of 01/19/2016  11:50 AM       Varney Biles, MD 01/20/16 [phone]

## 2016-01-18 NOTE — ED Triage Notes (Signed)
Patient arrives via EMS.  Call was for resp distress.  Upon EMS arrival patient was sitting up talking but had trouble with complete sentences.  EMS started albuterol treatment, adm Mag 2gm, Solumedrol 125mg . Upon arrival to trauma C patient was diaphoretic, answering in single words.

## 2016-01-18 NOTE — Progress Notes (Signed)
[facility] PROGRESS NOTE    Progress Note  Victor Rowe  [id number] DOB: 04-20-1961 DOA: 01/18/2016 PCP: Benito Mccreedy, MD     Brief Narrative:   Victor Rowe is an 55 y.o. male with past medical history of COPD that comes into the ED for the third time for shortness of breath  Assessment/Plan:   COPD with acute exacerbation (HCC) Continue IV steroids inhalers and antibiotics, his saturations have remained stable. Continues to wheeze on physical exam.  Schizoaffective disorder, bipolar type ([place]) Continue current regimen no changes made.  Alcohol and tobacco abuse: Counseling.   DVT prophylaxis: lovenox Family Communication:none Disposition Plan/Barrier to D/C: home in am Code Status:     Code Status Orders        Start     Ordered   01/18/16 0524  Full code  Continuous     01/18/16 0524    Code Status History    Date Active Date Inactive Code Status Order ID Comments User Context   03/29/2015  3:45 AM 04/01/2015  5:27 PM Full Code [id number]  Laverle Hobby, PA-C Inpatient   03/28/2015  3:05 PM 03/29/2015  3:45 AM Full Code [id number]  Evelina Bucy, MD ED        IV Access:    Peripheral IV   Procedures and diagnostic studies:   Dg Chest 2 View  Result Date: 01/17/2016 CLINICAL DATA:  55 year old male with cough and wheezing and congestion EXAM: CHEST  2 VIEW COMPARISON:  Chest radiograph dated 01/14/2016 FINDINGS: The heart size and mediastinal contours are within normal limits. Both lungs are clear. The visualized skeletal structures are unremarkable. IMPRESSION: No active cardiopulmonary disease. Electronically Signed   By: Anner Crete M.D.   On: 01/17/2016 05:47   Dg Chest Port 1 View  Result Date: 01/18/2016 CLINICAL DATA:  Progressive shortness of breath tonight. EXAM: PORTABLE CHEST 1 VIEW COMPARISON:  Earlier this day at 0533 hour, also 01/14/2016 FINDINGS: Stable bronchial thickening. The cardiomediastinal contours are normal.  Pulmonary vasculature is normal. No consolidation, pleural effusion, or pneumothorax. No acute osseous abnormalities are seen. IMPRESSION: Stable bronchial thickening.  No new abnormality is seen. Electronically Signed   By: Jeb Levering M.D.   On: 01/18/2016 01:41     Medical Consultants:    None.  Anti-Infectives:   doxy  Subjective:    Victor Rowe he relates his breathing is improved compared to yesterday but not at baseline.  Objective:    Vitals:   01/18/16 0300 01/18/16 0453 01/18/16 0543 01/18/16 0809  BP: 106/78 107/66    Pulse: 114 (!) 111    Resp: 26 18    Temp:  97.8 F (36.6 C)    TempSrc:  Oral    SpO2: 100% 94% 92% 96%  Weight:  115.8 kg (255 lb 6.4 oz)    Height:        Intake/Output Summary (Last 24 hours) at 01/18/16 0820 Last data filed at 01/18/16 0559  Gross per 24 hour  Intake           514.58 ml  Output                0 ml  Net           514.58 ml   Filed Weights   01/18/16 0124 01/18/16 0453  Weight: 115.7 kg (255 lb) 115.8 kg (255 lb 6.4 oz)    Exam: General exam: In no acute distress. Respiratory system: Air movement and wheezing bilaterally. Cardiovascular  system: S1 & S2 heard, RRR. No JVD. Gastrointestinal system: Abdomen is nondistended, soft and nontender.  Central nervous system: Alert and oriented. No focal neurological deficits. Extremities: No pedal edema. Skin: No rashes, lesions or ulcers Psychiatry: Judgement and insight appear normal. Mood & affect appropriate.    Data Reviewed:    Labs: Basic Metabolic Panel:  Recent Labs Lab 01/18/16 0125 01/18/16 0553  NA 143 141  K 3.8 3.2*  CL 104 106  CO2  --  23  GLUCOSE 113* 186*  BUN 12 11  CREATININE 1.00 1.28*  CALCIUM  --  9.1   GFR Estimated Creatinine Clearance: 81.8 mL/min (by C-G formula based on SCr of 1.28 mg/dL). Liver Function Tests:  Recent Labs Lab 01/18/16 0553  AST 103*  ALT 52  ALKPHOS 48  BILITOT 0.5  PROT 7.5  ALBUMIN 4.4    No results for input(s): LIPASE, AMYLASE in the last 168 hours. No results for input(s): AMMONIA in the last 168 hours. Coagulation profile No results for input(s): INR, PROTIME in the last 168 hours.  CBC:  Recent Labs Lab 01/18/16 0118 01/18/16 0125 01/18/16 0553  WBC 11.4*  --  11.3*  NEUTROABS 5.1  --  10.2*  HGB 14.3 14.6 13.5  HCT 43.0 43.0 41.6  MCV 93.3  --  93.3  PLT 177  --  179   Cardiac Enzymes: No results for input(s): CKTOTAL, CKMB, CKMBINDEX, TROPONINI in the last 168 hours. BNP (last 3 results) No results for input(s): PROBNP in the last 8760 hours. CBG: No results for input(s): GLUCAP in the last 168 hours. D-Dimer: No results for input(s): DDIMER in the last 72 hours. Hgb A1c: No results for input(s): HGBA1C in the last 72 hours. Lipid Profile: No results for input(s): CHOL, HDL, LDLCALC, TRIG, CHOLHDL, LDLDIRECT in the last 72 hours. Thyroid function studies:  Recent Labs  01/18/16 0553  TSH 0.483   Anemia work up: No results for input(s): VITAMINB12, FOLATE, FERRITIN, TIBC, IRON, RETICCTPCT in the last 72 hours. Sepsis Labs:  Recent Labs Lab 01/18/16 0118 01/18/16 0553  WBC 11.4* 11.3*   Microbiology No results found for this or any previous visit (from the past 240 hour(s)).   Medications:   . ARIPiprazole  5 mg Oral QHS  . benztropine  0.5 mg Oral QHS  . budesonide (PULMICORT) nebulizer solution  0.25 mg Nebulization BID  . doxycycline (VIBRAMYCIN) IV  100 mg Intravenous Q12H  . enoxaparin (LOVENOX) injection  40 mg Subcutaneous Q24H  . ipratropium-albuterol  3 mL Nebulization Q4H  . lamoTRIgine  25 mg Oral QHS  . methylPREDNISolone (SOLU-MEDROL) injection  40 mg Intravenous Daily  . [START ON 01/19/2016] pneumococcal 23 valent vaccine  0.5 mL Intramuscular Tomorrow-1000  . traZODone  150 mg Oral QHS   Continuous Infusions:   Time spent: 25 min   LOS: 0 days   Charlynne Cousins  [facility] Pager  [phone]  *Please refer to [email or web address], password [email or web address] to get updated schedule on who will round on this patient, as hospitalists switch teams weekly. If 7PM-7AM, please contact night-coverage at [email or web address], password [email or web address] for any overnight needs.  01/18/2016, 8:20 AM

## 2016-01-18 NOTE — Progress Notes (Signed)
Rec'd pt in ED wearing nebulizer tx pt wob out of control and not stabilized.  MD ordered Bipap placed on niv 17/5 50 % also started neb through [facility] per MD

## 2016-01-18 NOTE — Progress Notes (Signed)
Pt arrived unit, MD notified.

## 2016-01-18 NOTE — H&P (Signed)
History and Physical    Victor Rowe [id number] DOB: 28-Jul-1960 DOA: 01/18/2016  PCP: Victor Mccreedy, MD  Patient coming from: Home.  Chief Complaint: Shortness of breath.  HPI: Victor Rowe is a 55 y.o. male with COPD/asthma, hepatitis C in remission, schizoaffective disorder presents to the ER because of ongoing shortness of breath with wheezing. Patient had come to the ER at least twice last week and was prescribed prednisone and antibiotics for COPD exacerbation. Despite taking which patient was still wheezing and initially on presentation in the ER was placed on BiPAP. With nebulizer treatment patient's wheezing improved and is off BiPAP at this time. Denies any chest pain fever or chills. Has some productive cough. Chest x-ray shows bronchitis changes. Patient is being admitted for further observation for treatment of acute exacerbation of COPD. Patient quit smoking more than one month ago.   ED Course: Patient was placed on BiPAP initially and after nebulizer treatment patient is off BiPAP now.  Review of Systems: As per HPI, rest all negative.   Past Medical History:  Diagnosis Date  . Anxiety   . Asthma   . COPD (chronic obstructive pulmonary disease) ([place])   . Depression   . Hepatitis    Hep C  Pt states he has taken a 12 week medication for disease    History reviewed. No pertinent surgical history.   reports that he quit smoking about 3 weeks ago. His smoking use included Cigarettes. He smoked 0.00 packs per day. He has never used smokeless tobacco. He reports that he uses drugs, including Marijuana. He reports that he does not drink alcohol.  No Known Allergies  Family History  Problem Relation Age of Onset  . Alcoholism Father     Prior to Admission medications   Medication Sig Start Date End Date Taking? Authorizing Provider  albuterol (PROVENTIL HFA;VENTOLIN HFA) 108 (90 Base) MCG/ACT inhaler Inhale 1-2 puffs into the lungs every 6 (six) hours as needed  for wheezing or shortness of breath.   Yes Historical Provider, MD  ARIPiprazole (ABILIFY) 5 MG tablet Take 1 tablet (5 mg total) by mouth at bedtime. 03/31/15  Yes Victor Buffalo, NP  azithromycin (ZITHROMAX) 250 MG tablet Take 1 tablet (250 mg total) by mouth daily. Take 1 every day until finished. 01/17/16  Yes Victor Dessert, MD  benztropine (COGENTIN) 0.5 MG tablet Take 1 tablet (0.5 mg total) by mouth at bedtime. 03/31/15  Yes Victor Buffalo, NP  lamoTRIgine (LAMICTAL) 25 MG tablet Take 1 tablet (25 mg total) by mouth at bedtime. 03/31/15  Yes Victor Buffalo, NP  predniSONE (DELTASONE) 10 MG tablet Take 2 tablets (20 mg total) by mouth daily. 01/15/16  Yes Victor Creamer, NP  traZODone (DESYREL) 150 MG tablet Take 1 tablet (150 mg total) by mouth at bedtime. 03/31/15  Yes Victor Buffalo, NP    Physical Exam: Vitals:   01/18/16 0230 01/18/16 0245 01/18/16 0300 01/18/16 0453  BP: 125/77 121/86 106/78 107/66  Pulse: 109 103 114 (!) 111  Resp: 21 19 26 18   Temp:    97.8 F (36.6 C)  TempSrc:    Oral  SpO2: 100% 100% 100% 94%  Weight:      Height:          Constitutional: Not in distress. Vitals:   01/18/16 0230 01/18/16 0245 01/18/16 0300 01/18/16 0453  BP: 125/77 121/86 106/78 107/66  Pulse: 109 103 114 (!) 111  Resp: 21 19 26 18   Temp:    97.8 F (36.6  C)  TempSrc:    Oral  SpO2: 100% 100% 100% 94%  Weight:      Height:       Eyes: Anicteric no pallor. ENMT: No discharge from ears eyes nose or mouth. Neck: No mass felt. No neck rigidity. Respiratory: Bilateral expiratory wheeze no crepitations. Cardiovascular: S1-S2 heard. Abdomen: Soft nontender bowel sounds present. No rhonchi or crepitations. Musculoskeletal: No edema. Skin: No rash. Neurologic: Alert awake oriented to time place and person. Moves all extremities. Psychiatric: Appears normal.   Labs on Admission: I have personally reviewed following labs and imaging studies  CBC:  Recent Labs Lab  01/18/16 0118 01/18/16 0125  WBC 11.4*  --   NEUTROABS 5.1  --   HGB 14.3 14.6  HCT 43.0 43.0  MCV 93.3  --   PLT 177  --    Basic Metabolic Panel:  Recent Labs Lab 01/18/16 0125  NA 143  K 3.8  CL 104  GLUCOSE 113*  BUN 12  CREATININE 1.00   GFR: Estimated Creatinine Clearance: 104.7 mL/min (by C-G formula based on SCr of 1 mg/dL). Liver Function Tests: No results for input(s): AST, ALT, ALKPHOS, BILITOT, PROT, ALBUMIN in the last 168 hours. No results for input(s): LIPASE, AMYLASE in the last 168 hours. No results for input(s): AMMONIA in the last 168 hours. Coagulation Profile: No results for input(s): INR, PROTIME in the last 168 hours. Cardiac Enzymes: No results for input(s): CKTOTAL, CKMB, CKMBINDEX, TROPONINI in the last 168 hours. BNP (last 3 results) No results for input(s): PROBNP in the last 8760 hours. HbA1C: No results for input(s): HGBA1C in the last 72 hours. CBG: No results for input(s): GLUCAP in the last 168 hours. Lipid Profile: No results for input(s): CHOL, HDL, LDLCALC, TRIG, CHOLHDL, LDLDIRECT in the last 72 hours. Thyroid Function Tests: No results for input(s): TSH, T4TOTAL, FREET4, T3FREE, THYROIDAB in the last 72 hours. Anemia Panel: No results for input(s): VITAMINB12, FOLATE, FERRITIN, TIBC, IRON, RETICCTPCT in the last 72 hours. Urine analysis: No results found for: COLORURINE, APPEARANCEUR, LABSPEC, PHURINE, GLUCOSEU, HGBUR, BILIRUBINUR, KETONESUR, PROTEINUR, UROBILINOGEN, NITRITE, LEUKOCYTESUR Sepsis Labs: @LABRCNTIP (procalcitonin:4,lacticidven:4) )No results found for this or any previous visit (from the past 240 hour(s)).   Radiological Exams on Admission: Dg Chest 2 View  Result Date: 01/17/2016 CLINICAL DATA:  55 year old male with cough and wheezing and congestion EXAM: CHEST  2 VIEW COMPARISON:  Chest radiograph dated 01/14/2016 FINDINGS: The heart size and mediastinal contours are within normal limits. Both lungs are clear.  The visualized skeletal structures are unremarkable. IMPRESSION: No active cardiopulmonary disease. Electronically Signed   By: Victor Rowe M.D.   On: 01/17/2016 05:47   Dg Chest Port 1 View  Result Date: 01/18/2016 CLINICAL DATA:  Progressive shortness of breath tonight. EXAM: PORTABLE CHEST 1 VIEW COMPARISON:  Earlier this day at 0533 hour, also 01/14/2016 FINDINGS: Stable bronchial thickening. The cardiomediastinal contours are normal. Pulmonary vasculature is normal. No consolidation, pleural effusion, or pneumothorax. No acute osseous abnormalities are seen. IMPRESSION: Stable bronchial thickening.  No new abnormality is seen. Electronically Signed   By: Jeb Levering M.D.   On: 01/18/2016 01:41    EKG: Independently reviewed. Sinus tachycardia.  Assessment/Plan Principal Problem:   COPD with acute exacerbation (HCC) Active Problems:   Schizoaffective disorder, bipolar type (HCC)   Acute bronchitis    1. COPD exacerbation - patient is placed on Solu-Medrol, Atrovent and albuterol nebulizers, Pulmicort and doxycycline. Closely observe. 2. Schizoaffective disorder - will continue present home  medications. 3. History of hepatitis C in remission. 4. Tobacco abuse - quit last month. 5. Alcohol abuse - has not had any alcohol for last few months since the treatment for hepatitis C.   DVT prophylaxis: Lovenox. Code Status: Full code.  Family Communication: Patient's family at the bedside.  Disposition Plan: Home.  Consults called: None.  Admission status: Observation. Telemetry.    Rise Patience MD [facility] Pager [phone].  If 7PM-7AM, please contact night-coverage [email or web address] Password [email or web address]  01/18/2016, 5:24 AM

## 2016-01-19 DIAGNOSIS — J441 Chronic obstructive pulmonary disease with (acute) exacerbation: Secondary | ICD-10-CM

## 2016-01-19 DIAGNOSIS — F250 Schizoaffective disorder, bipolar type: Secondary | ICD-10-CM

## 2016-01-19 LAB — BASIC METABOLIC PANEL
ANION GAP: 9 (ref 5–15)
BUN: 14 mg/dL (ref 6–20)
CALCIUM: 9.8 mg/dL (ref 8.9–10.3)
CO2: 27 mmol/L (ref 22–32)
CREATININE: 1.04 mg/dL (ref 0.61–1.24)
Chloride: 106 mmol/L (ref 101–111)
GFR calc non Af Amer: >60 mL/min (ref >60)
Glucose, Bld: 103 mg/dL — ABNORMAL HIGH (ref 65–99)
Potassium: 4 mmol/L (ref 3.5–5.1)
SODIUM: 142 mmol/L (ref 135–145)

## 2016-01-19 MED ORDER — ALBUTEROL SULFATE HFA 108 (90 BASE) MCG/ACT IN AERS
1.0000 | INHALATION_SPRAY | Freq: Four times a day (QID) | RESPIRATORY_TRACT | 3 refills | Status: AC | PRN
Start: 2016-01-19 — End: ?

## 2016-01-19 MED ORDER — AZITHROMYCIN 250 MG PO TABS: 250.0000 mg | ORAL_TABLET | Freq: Every day | ORAL | 0 refills | Status: DC

## 2016-01-19 MED ORDER — IPRATROPIUM-ALBUTEROL 0.5-2.5 (3) MG/3ML IN SOLN
3.0000 mL | Freq: Two times a day (BID) | RESPIRATORY_TRACT | Status: DC
  Administered 2016-01-19: 3 mL via RESPIRATORY_TRACT

## 2016-01-19 NOTE — Progress Notes (Signed)
Discharge instructions follow appts and Rx's explained and provided to patient and family, verbalized understanding. Patient discharged home left floor via wheelchair accompanied by volunteers no c/o pain or shortness of breath at discharge.  Cort Dragoo, Tivis Ringer, RN

## 2016-01-19 NOTE — Discharge Summary (Signed)
Physician Discharge Summary  Victor Rowe [id number] DOB: 11/28/1960 DOA: 01/18/2016  PCP: Benito Mccreedy, MD  Admit date: 01/18/2016 Discharge date: 01/19/2016  Admitted From: home Disposition:  Home  Recommendations for Outpatient Follow-up:  1. Follow up with PCP in 1-2 weeks, will need to be get PFT's as an outpatient   Home Health:no Equipment/Devices:no  Discharge Condition:stable CODE STATUS:full Diet recommendation: Heart Healthy  Brief/Interim Summary: As per admission note : 55 y.o. male with COPD/asthma, hepatitis C in remission, schizoaffective disorder presents to the ER because of ongoing shortness of breath with wheezing. Patient had come to the ER at least twice last week and was prescribed prednisone and antibiotics for COPD exacerbation. Despite taking which patient was still wheezing and initially on presentation in the ER was placed on BiPAP.  Discharge Diagnoses:  COPD with acute exacerbation ([place]): Started on IV steroids, abx and inhaler. By the next day was able to walk and talk without being SOB. Change to an oral regimen which will cont at home for 7 days.  Schizoaffective disorder, bipolar type ([place]) No changes made.    Discharge Instructions  Discharge Instructions    Diet - low sodium heart healthy    Complete by:  As directed   Increase activity slowly    Complete by:  As directed       Medication List    STOP taking these medications   predniSONE 10 MG tablet Commonly known as:  DELTASONE     TAKE these medications   albuterol 108 (90 Base) MCG/ACT inhaler Commonly known as:  PROVENTIL HFA;VENTOLIN HFA Inhale 1-2 puffs into the lungs every 6 (six) hours as needed for wheezing or shortness of breath.   ARIPiprazole 5 MG tablet Commonly known as:  ABILIFY Take 1 tablet (5 mg total) by mouth at bedtime.   azithromycin 250 MG tablet Commonly known as:  ZITHROMAX Take 1 tablet (250 mg total) by mouth daily. Take 1 every day until  finished.   benztropine 0.5 MG tablet Commonly known as:  COGENTIN Take 1 tablet (0.5 mg total) by mouth at bedtime.   lamoTRIgine 25 MG tablet Commonly known as:  LAMICTAL Take 1 tablet (25 mg total) by mouth at bedtime.   traZODone 150 MG tablet Commonly known as:  DESYREL Take 1 tablet (150 mg total) by mouth at bedtime.      Follow-up Information    OSEI-BONSU,GEORGE, MD .   Specialty:  Internal Medicine Contact information: [id number] [place] [address] [phone]          No Known Allergies  Consultations:  none   Procedures/Studies: Dg Chest 2 View  Result Date: 01/17/2016 CLINICAL DATA:  55 year old male with cough and wheezing and congestion EXAM: CHEST  2 VIEW COMPARISON:  Chest radiograph dated 01/14/2016 FINDINGS: The heart size and mediastinal contours are within normal limits. Both lungs are clear. The visualized skeletal structures are unremarkable. IMPRESSION: No active cardiopulmonary disease. Electronically Signed   By: Anner Crete M.D.   On: 01/17/2016 05:47   Dg Chest 2 View  Result Date: 01/14/2016 CLINICAL DATA:  Shortness of breath. Productive cough, chest and nasal congestion. Wheezing for 2 weeks. EXAM: CHEST  2 VIEW COMPARISON:  Radiograph 08/21/2015 most recent chest CT 08/29/2014 FINDINGS: Emphysema and bronchial wall thickening. Degree bronchial thickening has increased from prior exam. No confluent airspace disease. Heart size and mediastinal contours are normal. Remote granulomatous disease in the right midlung with small calcified nodules. No pleural effusion  or pneumothorax. No pulmonary edema. Unchanged osseous structures. IMPRESSION: 1. Increased bronchial wall thickening suggesting acute on chronic bronchitis. 2. Emphysema. Electronically Signed   By: Jeb Levering M.D.   On: 01/14/2016 22:08  Dg Chest Port 1 View  Result Date: 01/18/2016 CLINICAL DATA:  Progressive shortness of breath tonight. EXAM:  PORTABLE CHEST 1 VIEW COMPARISON:  Earlier this day at 0533 hour, also 01/14/2016 FINDINGS: Stable bronchial thickening. The cardiomediastinal contours are normal. Pulmonary vasculature is normal. No consolidation, pleural effusion, or pneumothorax. No acute osseous abnormalities are seen. IMPRESSION: Stable bronchial thickening.  No new abnormality is seen. Electronically Signed   By: Jeb Levering M.D.   On: 01/18/2016 01:41      Subjective:   Discharge Exam: Vitals:   01/18/16 2325 01/19/16 0513  BP:  121/70  Pulse: 94 83  Resp: 18 16  Temp:  98.5 F (36.9 C)   Vitals:   01/18/16 2038 01/18/16 2140 01/18/16 2325 01/19/16 0513  BP:  132/71  121/70  Pulse: (!) 107 100 94 83  Resp: 18 18 18 16   Temp:  98.8 F (37.1 C)  98.5 F (36.9 C)  TempSrc:      SpO2: 96% 96% 92% 98%  Weight:      Height:        General: Pt is alert, awake, not in acute distress Cardiovascular: RRR, S1/S2 +, no rubs, no gallops Respiratory: CTA bilaterally, no wheezing, no rhonchi Abdominal: Soft, NT, ND, bowel sounds + Extremities: no edema, no cyanosis    The results of significant diagnostics from this hospitalization (including imaging, microbiology, ancillary and laboratory) are listed below for reference.     Microbiology: No results found for this or any previous visit (from the past 240 hour(s)).   Labs: BNP (last 3 results) No results for input(s): BNP in the last 8760 hours. Basic Metabolic Panel:  Recent Labs Lab 01/18/16 0125 01/18/16 0553 01/19/16 0601  NA 143 141 142  K 3.8 3.2* 4.0  CL 104 106 106  CO2  --  23 27  GLUCOSE 113* 186* 103*  BUN 12 11 14   CREATININE 1.00 1.28* 1.04  CALCIUM  --  9.1 9.8   Liver Function Tests:  Recent Labs Lab 01/18/16 0553  AST 103*  ALT 52  ALKPHOS 48  BILITOT 0.5  PROT 7.5  ALBUMIN 4.4   No results for input(s): LIPASE, AMYLASE in the last 168 hours. No results for input(s): AMMONIA in the last 168  hours. CBC:  Recent Labs Lab 01/18/16 0118 01/18/16 0125 01/18/16 0553  WBC 11.4*  --  11.3*  NEUTROABS 5.1  --  10.2*  HGB 14.3 14.6 13.5  HCT 43.0 43.0 41.6  MCV 93.3  --  93.3  PLT 177  --  179   Cardiac Enzymes: No results for input(s): CKTOTAL, CKMB, CKMBINDEX, TROPONINI in the last 168 hours. BNP: Invalid input(s): POCBNP CBG: No results for input(s): GLUCAP in the last 168 hours. D-Dimer No results for input(s): DDIMER in the last 72 hours. Hgb A1c No results for input(s): HGBA1C in the last 72 hours. Lipid Profile No results for input(s): CHOL, HDL, LDLCALC, TRIG, CHOLHDL, LDLDIRECT in the last 72 hours. Thyroid function studies  Recent Labs  01/18/16 0553  TSH 0.483   Anemia work up No results for input(s): VITAMINB12, FOLATE, FERRITIN, TIBC, IRON, RETICCTPCT in the last 72 hours. Urinalysis No results found for: COLORURINE, APPEARANCEUR, LABSPEC, PHURINE, GLUCOSEU, HGBUR, BILIRUBINUR, KETONESUR, PROTEINUR, UROBILINOGEN, NITRITE, LEUKOCYTESUR Sepsis  Labs Invalid input(s): PROCALCITONIN,  WBC,  LACTICIDVEN Microbiology No results found for this or any previous visit (from the past 240 hour(s)).   Time coordinating discharge: Over 30 minutes  SIGNED:   Charlynne Cousins, MD  [facility] 01/19/2016, 8:13 AM Pager   If 7PM-7AM, please contact night-coverage [email or web address] Password [email or web address]

## 2016-01-19 NOTE — Care Management Note (Signed)
Case Management Note  Patient Details  Name: Victor Rowe MRN: [id number] Date of Birth: 31-Aug-1960  Subjective/Objective:                 Patient from home with COPD. Patient denies difficulties getting medications. No DME needed.    Action/Plan:  Will DC to home self care Expected Discharge Date:                  Expected Discharge Plan:  Home/Self Care  In-House Referral:  NA  Discharge planning Services  CM Consult  Post Acute Care Choice:  NA Choice offered to:  NA  DME Arranged:  N/A DME Agency:  NA  HH Arranged:  NA HH Agency:  NA  Status of Service:  Completed, signed off  If discussed at [place] of Stay Meetings, dates discussed:    Additional Comments:  Carles Collet, RN 01/19/2016, 9:58 AM

## 2016-02-16 ENCOUNTER — Institutional Professional Consult (permissible substitution): Payer: Self-pay | Admitting: Pulmonary Disease

## 2016-03-10 ENCOUNTER — Emergency Department (HOSPITAL_COMMUNITY)
Admission: EM | Admit: 2016-03-10 | Discharge: 2016-03-10 | Disposition: A | Discharge status: Home / Self Care | Payer: Worker's Compensation | Attending: Emergency Medicine | Admitting: Emergency Medicine

## 2016-03-10 ENCOUNTER — Encounter (HOSPITAL_COMMUNITY): Payer: Self-pay | Admitting: Emergency Medicine

## 2016-03-10 DIAGNOSIS — M545 Low back pain: Secondary | ICD-10-CM | POA: Diagnosis not present

## 2016-03-10 DIAGNOSIS — X500XXA Overexertion from strenuous movement or load, initial encounter: Secondary | ICD-10-CM | POA: Insufficient documentation

## 2016-03-10 DIAGNOSIS — Y990 Civilian activity done for income or pay: Secondary | ICD-10-CM | POA: Diagnosis not present

## 2016-03-10 DIAGNOSIS — Y939 Activity, unspecified: Secondary | ICD-10-CM | POA: Diagnosis not present

## 2016-03-10 DIAGNOSIS — Z87891 Personal history of nicotine dependence: Secondary | ICD-10-CM | POA: Diagnosis not present

## 2016-03-10 DIAGNOSIS — J449 Chronic obstructive pulmonary disease, unspecified: Secondary | ICD-10-CM | POA: Diagnosis not present

## 2016-03-10 DIAGNOSIS — Y929 Unspecified place or not applicable: Secondary | ICD-10-CM | POA: Insufficient documentation

## 2016-03-10 MED ORDER — NAPROXEN 250 MG PO TABS: 250.0000 mg | ORAL_TABLET | Freq: Two times a day (BID) | ORAL | 0 refills | Status: DC

## 2016-03-10 NOTE — ED Notes (Signed)
Pt c/o lower back pain while working - states turned and felt back "pop". Spouse and child w/pt. Pt has note from employer.

## 2016-03-10 NOTE — ED Triage Notes (Signed)
Pt presents to ED for assessment of lower back pain, "right in the middle".  Sts he was at work lifting a tray of glasses and he twisted and felt a "pop" in his back and started having pain.  Denies leg pain, weakness.  Pt ambulatory.  Denies changes in bowel or bladder.

## 2016-03-10 NOTE — ED Provider Notes (Signed)
[facility] DEPT Provider Note   CSN: [id number] Arrival date & time: 03/10/16  1532  By signing my name below, I, Soijett Blue, attest that this documentation has been prepared under the direction and in the presence of Will Alanzo Lamb, PA-C Electronically Signed: Soijett Blue, ED Scribe. 03/10/16. 6:18 PM.   History   Chief Complaint Chief Complaint  Patient presents with  . Back Pain    HPI  Victor Rowe is a 55 y.o. male who presents to the Emergency Department complaining of lower back pain onset PTA. Pt notes that he was at work lifting a heavy tray of glasses when he twisted and felt a "pop" to his lower back with immediate pain to the area following. Pt denies any recent fall or injury. He reports his pain has since resolved. Pt denies having lower back pain at this time. He states that he has not tried any medications for the relief of his symptoms. Pt denies bowel/bladder incontinence, urinary frequency, urinary urgency, dysuria, hematuria, difficulty urinating, numbness, tingling, weakness, gait problem, color change, rash, wound, and any other symptoms. Denies CA or IV drug use.    The history is provided by the patient. No language interpreter was used.    Past Medical History:  Diagnosis Date  . Anxiety   . Asthma   . COPD (chronic obstructive pulmonary disease) ([place])   . Depression   . Hepatitis    Hep C  Pt states he has taken a 12 week medication for disease    Patient Active Problem List   Diagnosis Date Noted  . COPD with acute exacerbation ([place]) 01/18/2016  . COPD exacerbation ([place]) 01/18/2016  . Acute bronchitis 01/18/2016  . Cannabis use disorder, mild, abuse 03/29/2015  . Schizoaffective disorder, bipolar type ([place]) 03/29/2015  . Alcohol use disorder, moderate, dependence ([place]) 03/29/2015  . Hepatic cirrhosis ([place]) 01/24/2015  . Chronic hepatitis C without hepatic coma ([place]) 11/02/2014    History reviewed. No pertinent surgical  history.     Home Medications    Prior to Admission medications   Medication Sig Start Date End Date Taking? Authorizing Provider  albuterol (PROVENTIL HFA;VENTOLIN HFA) 108 (90 Base) MCG/ACT inhaler Inhale 1-2 puffs into the lungs every 6 (six) hours as needed for wheezing or shortness of breath. 01/19/16   Charlynne Cousins, MD  ARIPiprazole (ABILIFY) 5 MG tablet Take 1 tablet (5 mg total) by mouth at bedtime. 03/31/15   Kerrie Buffalo, NP  azithromycin (ZITHROMAX) 250 MG tablet Take 1 tablet (250 mg total) by mouth daily. Take 1 every day until finished. 01/19/16   Charlynne Cousins, MD  benztropine (COGENTIN) 0.5 MG tablet Take 1 tablet (0.5 mg total) by mouth at bedtime. 03/31/15   Kerrie Buffalo, NP  lamoTRIgine (LAMICTAL) 25 MG tablet Take 1 tablet (25 mg total) by mouth at bedtime. 03/31/15   Kerrie Buffalo, NP  naproxen (NAPROSYN) 250 MG tablet Take 1 tablet (250 mg total) by mouth 2 (two) times daily with a meal. 03/10/16   Waynetta Pean, PA-C  traZODone (DESYREL) 150 MG tablet Take 1 tablet (150 mg total) by mouth at bedtime. 03/31/15   Kerrie Buffalo, NP    Family History Family History  Problem Relation Age of Onset  . Alcoholism Father     Social History Social History  Substance Use Topics  . Smoking status: Former Smoker    Packs/day: 0.00    Types: Cigarettes    Quit date: 12/28/2015  . Smokeless tobacco: Never Used  Comment: "future goal, not right now"  . Alcohol use No     Comment: socially     Allergies   Review of patient's allergies indicates no known allergies.   Review of Systems Review of Systems  Constitutional: Negative for fever.  Cardiovascular: Negative for leg swelling.  Gastrointestinal: Negative for abdominal pain, nausea and vomiting.       No bowel incontinence.  Genitourinary: Negative for difficulty urinating, dysuria, frequency, hematuria and urgency.       No bladder incontinence.  Musculoskeletal: Positive for back pain  (lower). Negative for gait problem and neck pain.  Skin: Negative for color change, rash and wound.  Neurological: Negative for weakness and numbness.       No tingling     Physical Exam Updated Vital Signs BP 125/87 (BP Location: Left Arm)   Pulse 82   Temp 98.1 F (36.7 C) (Oral)   Resp 14   Ht 6' (1.829 m)   Wt 123.4 kg   SpO2 96%   BMI 36.89 kg/m   Physical Exam  Constitutional: He appears well-developed and well-nourished. No distress.  Non-toxic appearing.  HENT:  Head: Normocephalic and atraumatic.  Eyes: Conjunctivae are normal. Pupils are equal, round, and reactive to light. Right eye exhibits no discharge. Left eye exhibits no discharge.  Neck: Neck supple.  Cardiovascular: Normal rate, regular rhythm, normal heart sounds and intact distal pulses.   Pulmonary/Chest: Effort normal and breath sounds normal. No respiratory distress.  Abdominal: Soft. There is no tenderness.  Musculoskeletal: Normal range of motion. He exhibits no edema, tenderness or deformity.  No midline neck or back tenderness. No back deformity, erythema, edema, or ecchymosis. No LE edema or tenderness. Good strength to bilateral lower extremities.   Lymphadenopathy:    He has no cervical adenopathy.  Neurological: He is alert. He has normal reflexes. He displays normal reflexes. No sensory deficit. Coordination normal.  Bilateral patellar DTRs intact. Nl gait. Sensation intact to BLE.   Skin: Skin is warm and dry. Capillary refill takes less than 2 seconds. No rash noted. He is not diaphoretic. No erythema. No pallor.  Psychiatric: He has a normal mood and affect. His behavior is normal.  Nursing note and vitals reviewed.    ED Treatments / Results  DIAGNOSTIC STUDIES: Oxygen Saturation is 96% on RA, nl by my interpretation.    COORDINATION OF CARE: 6:16 PM Discussed treatment plan with pt at bedside which includes naprosyn Rx and pt agreed to plan.   Procedures Procedures (including  critical care time)  Medications Ordered in ED Medications - No data to display   Initial Impression / Assessment and Plan / ED Course  I have reviewed the triage vital signs and the nursing notes.    Clinical Course     Patient presents to the ED with lower back pain s/p twisting and lifting a heavy tray of glasses while at work. He reports his pain has now resolved and he is currently pain free.  No neurological deficits and normal neuro exam.  Patient is ambulatory.  No loss of bowel or bladder control.  No concern for cauda equina.  No fever, night sweats, weight loss, h/o cancer, IVDA, no recent procedure to back. No urinary symptoms suggestive of UTI. Pt will be discharged home with naprosyn Rx in case his back pain returns. I discussed back exercises and how to appropriately lift heavy items. I advised the patient to follow-up with their primary care provider this week. I  advised the patient to return to the emergency department with new or worsening symptoms or new concerns. The patient verbalized understanding and agreement with plan.     Final Clinical Impressions(s) / ED Diagnoses   Final diagnoses:  Midline low back pain, with sciatica presence unspecified    New Prescriptions New Prescriptions   NAPROXEN (NAPROSYN) 250 MG TABLET    Take 1 tablet (250 mg total) by mouth 2 (two) times daily with a meal.    I personally performed the services described in this documentation, which was scribed in my presence. The recorded information has been reviewed and is accurate.       Waynetta Pean, PA-C 03/10/16 [place], MD 03/11/16 [id number]

## 2016-09-16 IMAGING — MR MR ABDOMEN WO/W CM
10 of 17 series · 20 of 48 positions shown · IV contrast (20    MH)
Comparison: Ultrasound on 10/02/2015 and CT on 08/29/2014

CLINICAL DATA: Gallbladder polyp seen on recent ultrasound.

EXAM:
MRI ABDOMEN WITHOUT AND WITH CONTRAST
TECHNIQUE: Multiplanar multisequence MR imaging of the abdomen was performed
both before and after the administration of intravenous contrast.
CONTRAST:  20mL MULTIHANCE GADOBENATE DIMEGLUMINE 529 MG/ML IV SOLN

[Series 3: T2 · axial · 5.0mm · 0.74mm/px · 1 of 47 slices shown (1 of 2)]
[im 1/47]
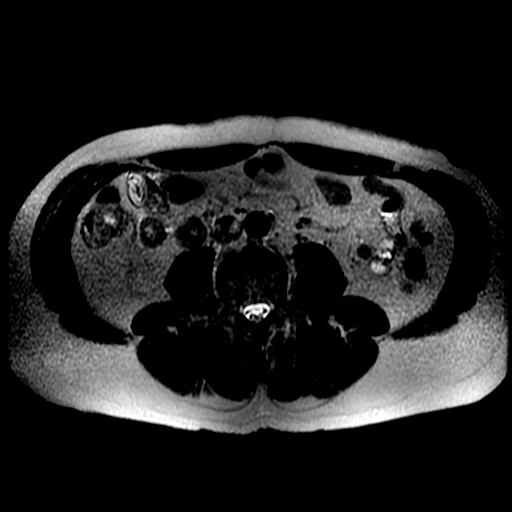

[Series 4: T2 · coronal · 5.0mm · 0.82mm/px · 1 of 40 slices shown (2 of 2)]
[im 1/40]
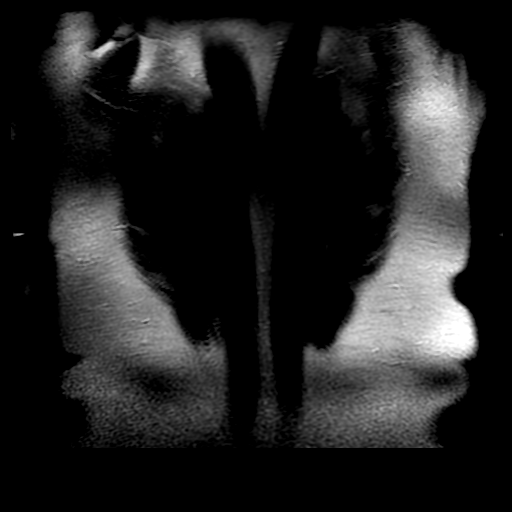

[Series 5: obl thru lesion · oblique · 4.0mm · 0.61mm/px · 1 of 8 slices shown]
[im 1/8]
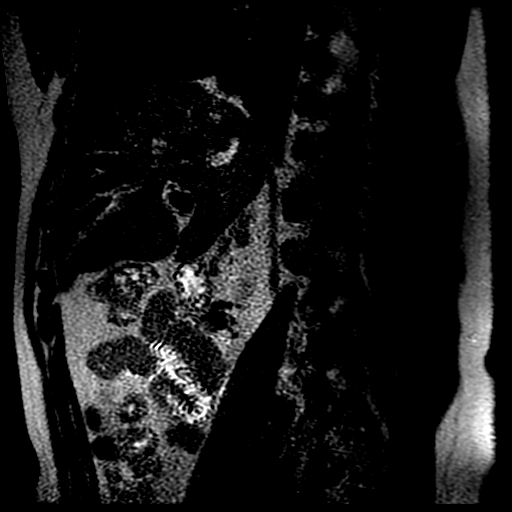

[Series 6: T2 fat-sat · axial · 5.0mm · 0.74mm/px · 1 of 47 slices shown]
[im 1/47]
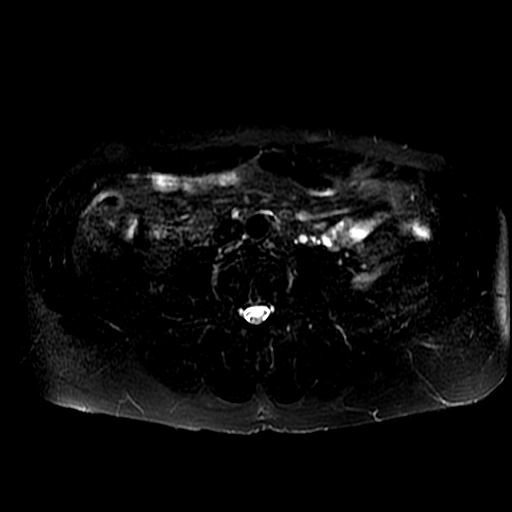

[Series 7: DWI b500 · axial · 6.0mm · 1.48mm/px · 1 of 56 slices shown]
[im 1/56]
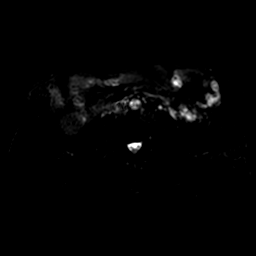

[Series 10: ax dualecho · axial · 5.0mm · 0.78mm/px · z∈[-139,+66]mm · 2 of 84 slices shown]
[im 1/84]
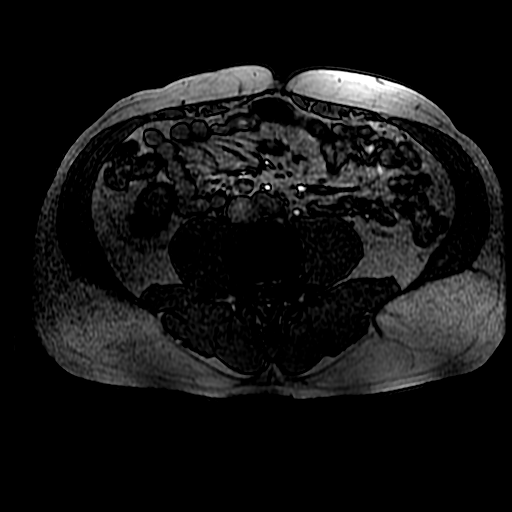
[im 84/84]
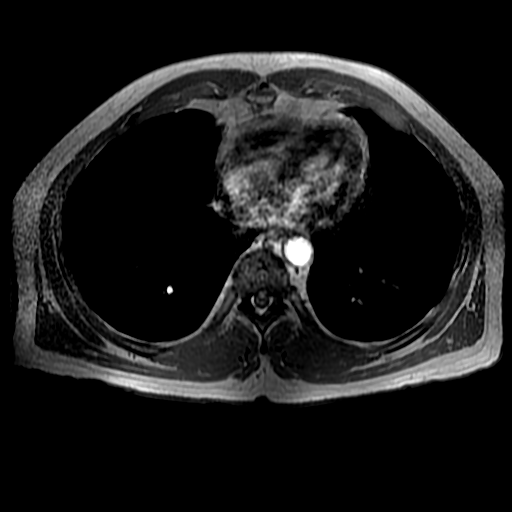

[Series 12: T1 dynamic post-contrast · coronal · 5.0mm · 0.78mm/px · 4 of 88 slices shown]
[im 1/88]
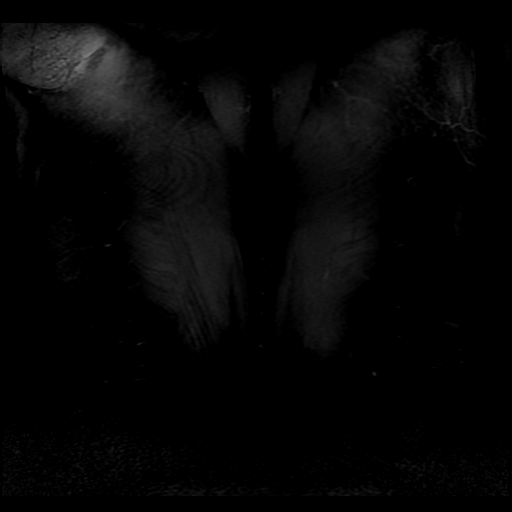
[im 30/88]
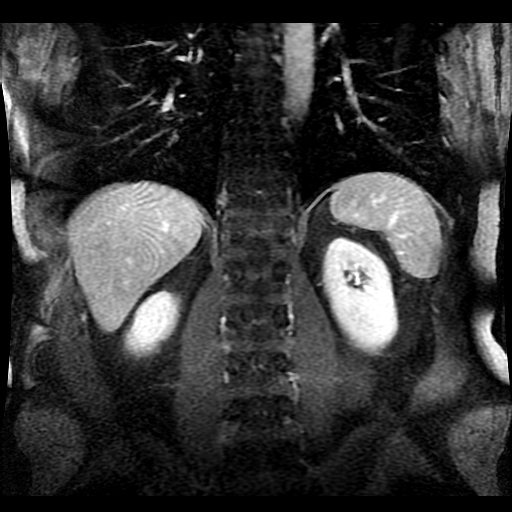
[im 59/88]
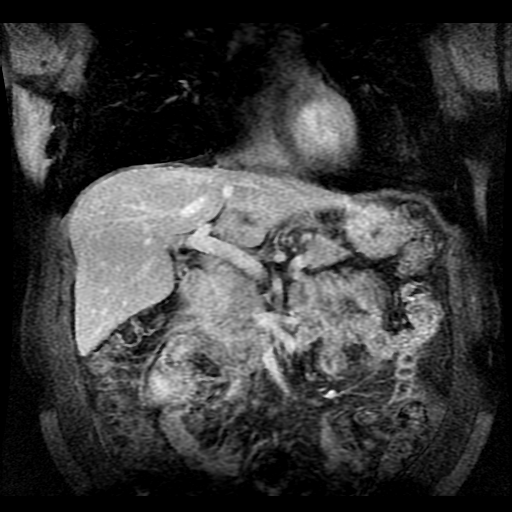
[im 88/88]
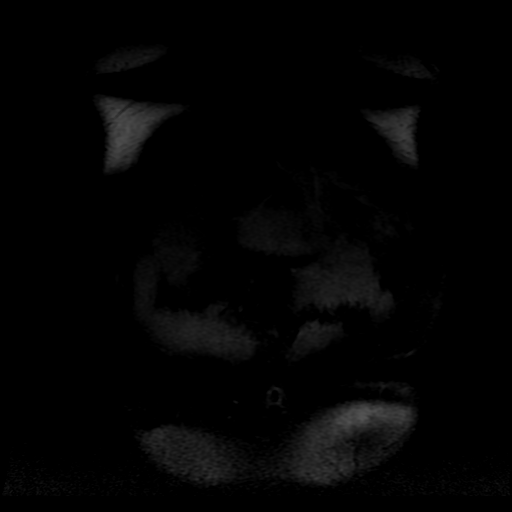

[Series 700: DWI · axial · 6.0mm · 1.48mm/px · 1 of 28 slices shown]
[im 1/28]
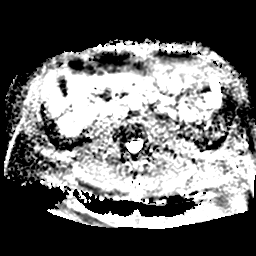

[Series 1100: T1 dynamic · axial · 5.0mm · 0.78mm/px · z∈[-154,+64]mm · 4 of 88 slices shown (1 of 2)]
[im 1/88]
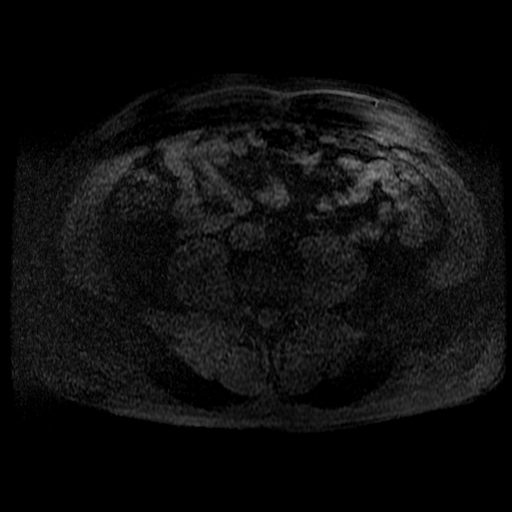
[im 30/88]
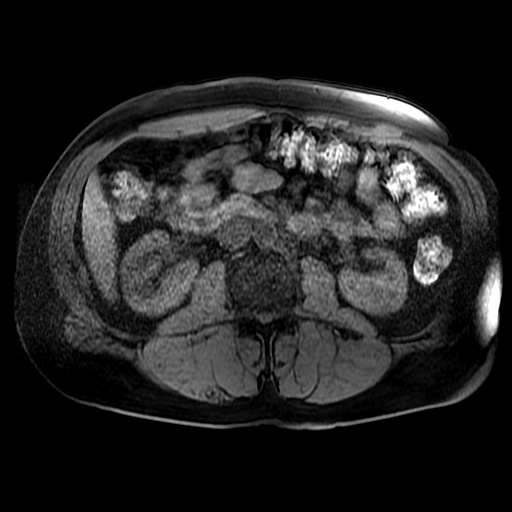
[im 59/88]
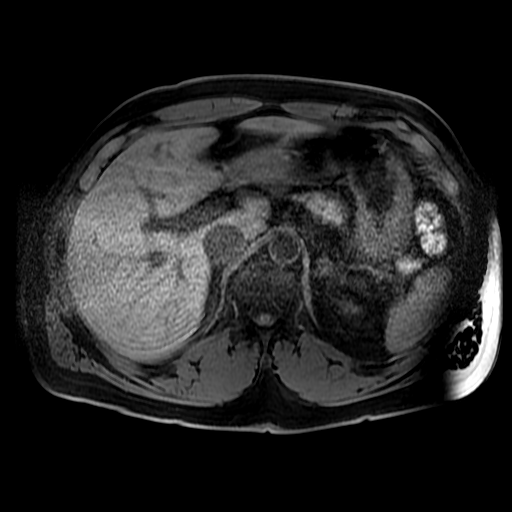
[im 88/88]
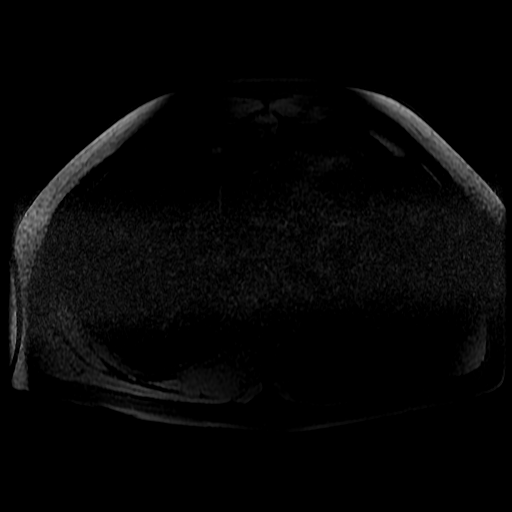

[Series 1101: T1 dynamic · axial · 5.0mm · 0.78mm/px · z∈[-154,+64]mm · 4 of 88 slices shown (2 of 2)]
[im 1/88]
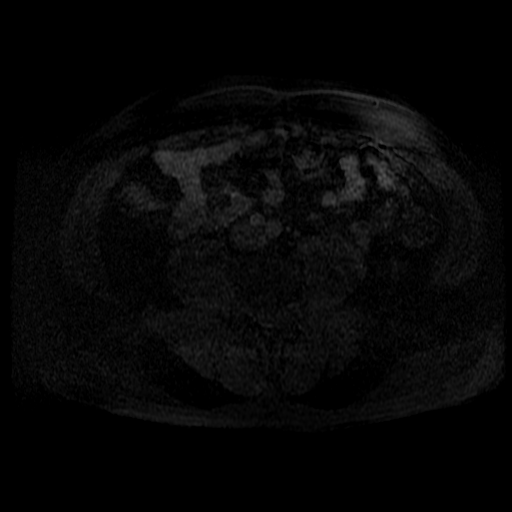
[im 30/88]
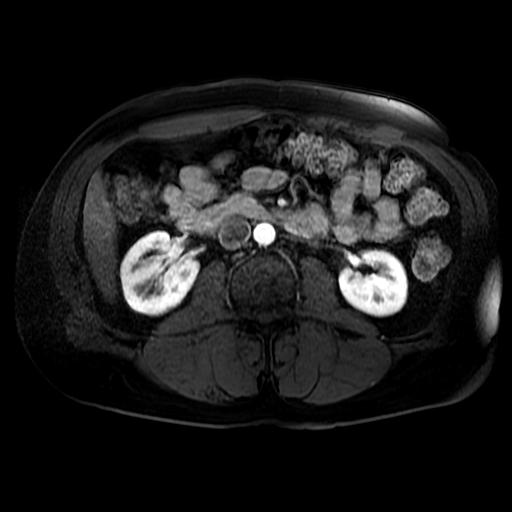
[im 59/88]
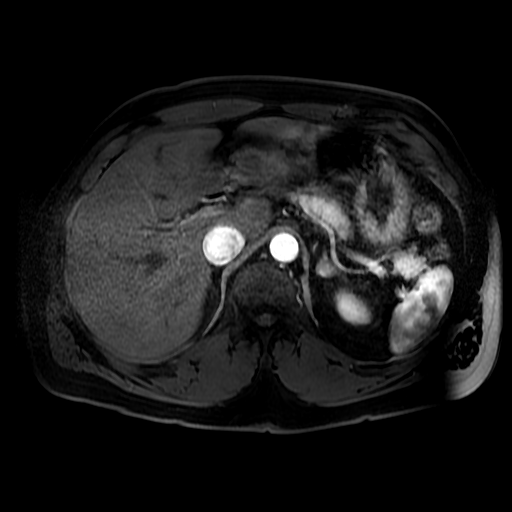
[im 88/88]
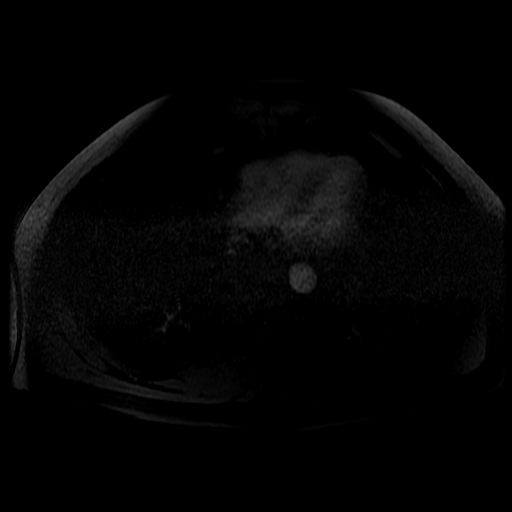

[20 of 48 positions shown; findings below may reference images not displayed]

FINDINGS: Lower chest:  No acute findings.

Hepatobiliary: No mass or other parenchymal abnormality identified.
A focal area of gallbladder wall thickening is seen in the fundus
which contains multiple tiny cystic foci (image 79/series 12). This
is consistent with focal adenomyomatosis. This appears stable
compared to previous CT in 4972. No solid gallbladder polyp or mass
identified. No evidence of cholecystitis or biliary ductal
dilatation.

Pancreas: No mass, inflammatory changes, or other parenchymal
abnormality identified.

Spleen:  Within normal limits in size and appearance.

Adrenals/Urinary Tract: Small bilateral adrenal masses are seen
which are stable and show signal dropout on opposed phase imaging,
consistent with benign adrenal adenomas. No evidence of renal masses
or hydronephrosis.

Stomach/Bowel: Visualized portions within the abdomen are
unremarkable.

Vascular/Lymphatic: No pathologically enlarged lymph nodes
identified. No abdominal aortic aneurysm demonstrated.

Other:  None.

Musculoskeletal:  No suspicious bone lesions identified.
IMPRESSION: Focal gallbladder wall thickening in the fundus which shows tiny
cystic foci, most consistent with focal gallbladder adenomyomatosis.
This appears stable compared to previous CT in 4972. No solid
gallbladder polyp or mass identified.

Stable small bilateral adrenal adenomas.

## 2017-11-15 DIAGNOSIS — R569 Unspecified convulsions: Secondary | ICD-10-CM

## 2017-11-15 HISTORY — DX: Unspecified convulsions: R56.9

## 2017-11-29 ENCOUNTER — Other Ambulatory Visit: Payer: Self-pay

## 2017-11-29 ENCOUNTER — Emergency Department (HOSPITAL_COMMUNITY)
Admission: EM | Admit: 2017-11-29 | Discharge: 2017-11-29 | Disposition: A | Discharge status: Home / Self Care | Payer: Medicaid Other | Attending: Emergency Medicine | Admitting: Emergency Medicine

## 2017-11-29 ENCOUNTER — Encounter (HOSPITAL_COMMUNITY): Payer: Self-pay | Admitting: Emergency Medicine

## 2017-11-29 ENCOUNTER — Emergency Department (HOSPITAL_COMMUNITY): Payer: Medicaid Other

## 2017-11-29 DIAGNOSIS — R569 Unspecified convulsions: Secondary | ICD-10-CM | POA: Insufficient documentation

## 2017-11-29 DIAGNOSIS — J449 Chronic obstructive pulmonary disease, unspecified: Secondary | ICD-10-CM | POA: Insufficient documentation

## 2017-11-29 DIAGNOSIS — Z79899 Other long term (current) drug therapy: Secondary | ICD-10-CM | POA: Insufficient documentation

## 2017-11-29 DIAGNOSIS — Z87891 Personal history of nicotine dependence: Secondary | ICD-10-CM | POA: Insufficient documentation

## 2017-11-29 DIAGNOSIS — M25512 Pain in left shoulder: Secondary | ICD-10-CM | POA: Insufficient documentation

## 2017-11-29 DIAGNOSIS — M19019 Primary osteoarthritis, unspecified shoulder: Secondary | ICD-10-CM

## 2017-11-29 LAB — COMPREHENSIVE METABOLIC PANEL
ALK PHOS: 45 U/L (ref 38–126)
ALT: 14 U/L — ABNORMAL LOW (ref 17–63)
ANION GAP: 12 (ref 5–15)
AST: 32 U/L (ref 15–41)
Albumin: 3.7 g/dL (ref 3.5–5.0)
BILIRUBIN TOTAL: 1 mg/dL (ref 0.3–1.2)
BUN: 11 mg/dL (ref 6–20)
CO2: 23 mmol/L (ref 22–32)
Calcium: 9.1 mg/dL (ref 8.9–10.3)
Chloride: 102 mmol/L (ref 101–111)
Creatinine, Ser: 1.38 mg/dL — ABNORMAL HIGH (ref 0.61–1.24)
GFR calc Af Amer: >60 mL/min (ref >60)
GFR, EST NON AFRICAN AMERICAN: 55 mL/min — AB (ref >60)
GLUCOSE: 93 mg/dL (ref 65–99)
POTASSIUM: 4.9 mmol/L (ref 3.5–5.1)
Sodium: 137 mmol/L (ref 135–145)
TOTAL PROTEIN: 6.6 g/dL (ref 6.5–8.1)

## 2017-11-29 LAB — CBC WITH DIFFERENTIAL/PLATELET
Abs Immature Granulocytes: 0 10*3/uL (ref 0.0–0.1)
Basophils Absolute: 0.1 10*3/uL (ref 0.0–0.1)
Basophils Relative: 1 %
EOS ABS: 0.1 10*3/uL (ref 0.0–0.7)
EOS PCT: 1 %
HEMATOCRIT: 43.7 % (ref 39.0–52.0)
HEMOGLOBIN: 15 g/dL (ref 13.0–17.0)
Immature Granulocytes: 0 %
LYMPHS ABS: 3 10*3/uL (ref 0.7–4.0)
LYMPHS PCT: 41 %
MCH: 31.7 pg (ref 26.0–34.0)
MCHC: 34.3 g/dL (ref 30.0–36.0)
MCV: 92.4 fL (ref 78.0–100.0)
MONO ABS: 0.9 10*3/uL (ref 0.1–1.0)
Monocytes Relative: 12 %
Neutro Abs: 3.3 10*3/uL (ref 1.7–7.7)
Neutrophils Relative %: 45 %
Platelets: 159 10*3/uL (ref 150–400)
RBC: 4.73 MIL/uL (ref 4.22–5.81)
RDW: 14.2 % (ref 11.5–15.5)
WBC: 7.3 10*3/uL (ref 4.0–10.5)

## 2017-11-29 LAB — ETHANOL

## 2017-11-29 LAB — CBG MONITORING, ED: Glucose-Capillary: 79 mg/dL (ref 65–99)

## 2017-11-29 MED ORDER — LORAZEPAM 2 MG/ML IJ SOLN
1.0000 mg | Freq: Once | INTRAMUSCULAR | Status: AC
  Administered 2017-11-29: 1 mg via INTRAVENOUS
  Filled 2017-11-29: qty 1

## 2017-11-29 MED ORDER — SODIUM CHLORIDE 0.9 % IV BOLUS (SEPSIS)
1000.0000 mL | Freq: Once | INTRAVENOUS | Status: AC
  Administered 2017-11-29: 1000 mL via INTRAVENOUS

## 2017-11-29 MED ORDER — CHLORDIAZEPOXIDE HCL 25 MG PO CAPS: ORAL_CAPSULE | ORAL | 0 refills | Status: DC

## 2017-11-29 MED ORDER — NAPROXEN 375 MG PO TABS: 375.0000 mg | ORAL_TABLET | Freq: Two times a day (BID) | ORAL | 0 refills | Status: DC

## 2017-11-29 MED ORDER — SODIUM CHLORIDE 0.9 % IV SOLN: 1000.0000 mL | INTRAVENOUS | Status: DC

## 2017-11-29 NOTE — ED Provider Notes (Signed)
[facility] EMERGENCY DEPARTMENT Provider Note   CSN: [id number] Arrival date & time: 11/29/17  1456     History   Chief Complaint Chief Complaint  Patient presents with  . Shoulder Pain/SEIZURE    HPI Victor Rowe is a 57 y.o. male.  HPI The patient initially presented to the emergency room for evaluation of left shoulder pain.  Patient states he has a history of right shoulder pain.  Patient states was lifting something heavy at work.  This is about 1 month ago.  While he was doing that he felt a pop in his shoulder and experienced some pain.  Patient states it is continued to bother him since then.  While the patient was in the triage room patient was noted to have an episode of incontinence and shaking.  This lasted around 30 seconds.  Patient states he remembers falling asleep but did not recall any prodrome.  He remembers hearing people say that it looks like he had a seizure.  Patient denies any history of seizure disorders or syncopal episodes.  He is does drink about a 40 ounce of alcohol daily.  He denies any prior history of withdrawal symptoms. Past Medical History:  Diagnosis Date  . Anxiety   . Asthma   . COPD (chronic obstructive pulmonary disease) ([place])   . Depression   . Hepatitis    Hep C  Pt states he has taken a 12 week medication for disease    Patient Active Problem List   Diagnosis Date Noted  . COPD with acute exacerbation ([place]) 01/18/2016  . COPD exacerbation ([place]) 01/18/2016  . Acute bronchitis 01/18/2016  . Cannabis use disorder, mild, abuse 03/29/2015  . Schizoaffective disorder, bipolar type ([place]) 03/29/2015  . Alcohol use disorder, moderate, dependence ([place]) 03/29/2015  . Hepatic cirrhosis ([place]) 01/24/2015  . Chronic hepatitis C without hepatic coma ([place]) 11/02/2014    History reviewed. No pertinent surgical history.      Home Medications    Prior to Admission medications   Medication Sig Start Date End Date Taking?  Authorizing Provider  budesonide-formoterol (SYMBICORT) 80-4.5 MCG/ACT inhaler Inhale 2 puffs into the lungs 2 (two) times daily.   Yes [provider]  albuterol (PROVENTIL HFA;VENTOLIN HFA) 108 (90 Base) MCG/ACT inhaler Inhale 1-2 puffs into the lungs every 6 (six) hours as needed for wheezing or shortness of breath. 01/19/16   Charlynne Cousins, MD  ARIPiprazole (ABILIFY) 5 MG tablet Take 1 tablet (5 mg total) by mouth at bedtime. 03/31/15   Kerrie Buffalo, NP  azithromycin (ZITHROMAX) 250 MG tablet Take 1 tablet (250 mg total) by mouth daily. Take 1 every day until finished. 01/19/16   Charlynne Cousins, MD  benztropine (COGENTIN) 0.5 MG tablet Take 1 tablet (0.5 mg total) by mouth at bedtime. 03/31/15   Kerrie Buffalo, NP  chlordiazePOXIDE (LIBRIUM) 25 MG capsule 50mg  PO TID x 1D, then 25-50mg  PO BID X 1D, then 25-50mg  PO QD X 1D 11/29/17   Dorie Rank, MD  lamoTRIgine (LAMICTAL) 25 MG tablet Take 1 tablet (25 mg total) by mouth at bedtime. 03/31/15   Kerrie Buffalo, NP  naproxen (NAPROSYN) 375 MG tablet Take 1 tablet (375 mg total) by mouth 2 (two) times daily. 11/29/17   Dorie Rank, MD  traZODone (DESYREL) 150 MG tablet Take 1 tablet (150 mg total) by mouth at bedtime. 03/31/15   Kerrie Buffalo, NP    Family History Family History  Problem Relation Age of Onset  .  Alcoholism Father     Social History Social History   Tobacco Use  . Smoking status: Former Smoker    Packs/day: 0.00    Types: Cigarettes    Last attempt to quit: 12/28/2015    Years since quitting: 1.9  . Smokeless tobacco: Never Used  . Tobacco comment: "future goal, not right now"  Substance Use Topics  . Alcohol use: Yes    Comment: socially  . Drug use: Yes    Types: Marijuana    Comment: per patient stopped 03/18/15     Allergies   Patient has no known allergies.   Review of Systems Review of Systems  All other systems reviewed and are negative.    Physical Exam Updated Vital  Signs BP (!) 131/92   Pulse 92   Temp 98 F (36.7 C) (Oral)   Resp (!) 21   SpO2 97%   Physical Exam  Constitutional: He appears well-developed and well-nourished. No distress.  HENT:  Head: Normocephalic and atraumatic.  Right Ear: External ear normal.  Left Ear: External ear normal.  Eyes: Conjunctivae are normal. Right eye exhibits no discharge. Left eye exhibits no discharge. No scleral icterus.  Neck: Neck supple. No tracheal deviation present.  Cardiovascular: Normal rate, regular rhythm and intact distal pulses.  Pulmonary/Chest: Effort normal and breath sounds normal. No stridor. No respiratory distress. He has no wheezes. He has no rales.  Abdominal: Soft. Bowel sounds are normal. He exhibits no distension. There is no tenderness. There is no rebound and no guarding.  Musculoskeletal: He exhibits no edema or tenderness.  Neurological: He is alert. He has normal strength. No cranial nerve deficit (no facial droop, extraocular movements intact, no slurred speech) or sensory deficit. He exhibits normal muscle tone. He displays no seizure activity. Coordination normal.  Skin: Skin is warm and dry. No rash noted.  Psychiatric: He has a normal mood and affect.  Nursing note and vitals reviewed.    ED Treatments / Results  Labs (all labs ordered are listed, but only abnormal results are displayed) Labs Reviewed  COMPREHENSIVE METABOLIC PANEL - Abnormal; Notable for the following components:      Result Value   Creatinine, Ser 1.38 (*)    ALT 14 (*)    GFR calc non Af Amer 55 (*)    All other components within normal limits  CBC WITH DIFFERENTIAL/PLATELET  ETHANOL  CBG MONITORING, ED    EKG EKG Interpretation  Date/Time:  Saturday November 29 2017 16:41:05 EDT Ventricular Rate:  74 PR Interval:    QRS Duration: 84 QT Interval:  395 QTC Calculation: 439 R Axis:   -54 Text Interpretation:  Sinus rhythm Probable left atrial enlargement Left anterior fascicular block  Since last tracing rate slower Confirmed by Dorie Rank [phone]) on 11/29/2017 4:46:24 PM   Radiology Dg Shoulder Right  Result Date: 11/29/2017 CLINICAL DATA:  Heard pop after lifting heavy object. Shoulder pain. EXAM: RIGHT SHOULDER - 2+ VIEW COMPARISON:  None. FINDINGS: Degenerative changes in the right [facility] and glenohumeral joints, most pronounced in the [facility] joint. No acute bony abnormality. Specifically, no fracture, subluxation, or dislocation. IMPRESSION: Moderate degenerative changes in the right shoulder. No acute bony abnormality. Electronically Signed   By: Rolm Baptise M.D.   On: 11/29/2017 15:39    Procedures Procedures (including critical care time)  Medications Ordered in ED Medications  sodium chloride 0.9 % bolus 1,000 mL (1,000 mLs Intravenous New Bag/Given 11/29/17 1640)    Followed by  sodium chloride 0.9 % bolus 1,000 mL (1,000 mLs Intravenous New Bag/Given 11/29/17 1651)    Followed by  0.9 %  sodium chloride infusion (has no administration in time range)  LORazepam (ATIVAN) injection 1 mg (1 mg Intravenous Given 11/29/17 1651)     Initial Impression / Assessment and Plan / ED Course  I have reviewed the triage vital signs and the nursing notes.  Pertinent labs & imaging results that were available during my care of the patient were reviewed by me and considered in my medical decision making (see chart for details).  Clinical Course as of Nov 30 1922  Sat Nov 29, 2017  1631 I was notified pt had seizure in A06 with emesis. RN noted brief postictal state. Pt moved to [facility] for further care. I briefly evaluated patient who was coherent, conversational in NAD. RN hooking him up to monitor. VS WNL. Discussed with Dr Peggyann Juba who will continue ER care.    [CG]    Clinical Course User Index [CG] Kinnie Feil, PA-C    Patient presented to the emergency room with a chief complaint of arm pain.  X-ray showed no evidence of dislocation or fracture.  He does have  arthritis.  We discussed outpatient follow-up with orthopedics and over-the-counter pain medication use as needed.  Patient also had a possible seizure in the emergency room.  This may be related to his daily alcohol use.  Patient was monitored the emergency room.  No signs of any withdrawal.  We discussed reducing his alcohol consumption.  Patient states he has quit in the past and is willing to do so.  I will give him a prescription for Librium just in case he starts to have any withdrawal type symptoms.  Patient understands follow-up with his primary care doctor next week  Final Clinical Impressions(s) / ED Diagnoses   Final diagnoses:  Shoulder arthritis  Seizure [facility])    ED Discharge Orders        Ordered    chlordiazePOXIDE (LIBRIUM) 25 MG capsule     11/29/17 1917    naproxen (NAPROSYN) 375 MG tablet  2 times daily     11/29/17 1917       Dorie Rank, MD 11/29/17 1925

## 2017-11-29 NOTE — ED Notes (Addendum)
Called to room by pt's family, pt noted to have vomited and having seizure like activity to the upper and lower extremities lasting about 30 seconds. Pt aroused with painful stimuli. Pt incontinent of urine, and diaphoretic. Family denies known hx of seizures, states pt does drink alcohol everyday.

## 2017-11-29 NOTE — ED Notes (Signed)
See provider assessment 

## 2017-11-29 NOTE — ED Triage Notes (Signed)
Pt c/o right shoulder pain x 1 month after lifting a 250lb pipe at work. Hx dislocation in that shoulder.

## 2017-11-29 NOTE — ED Notes (Signed)
Pt eating and drinking

## 2017-11-29 NOTE — Discharge Instructions (Addendum)
Try to cut back on your alcohol consumption take Librium to help if you start develop any shakes or tremors, return to the emergency room as needed for recurrent symptoms.  Otherwise follow-up with your orthopedic doctor for your shoulder and your primary care doctor next week

## 2018-02-25 ENCOUNTER — Encounter (HOSPITAL_BASED_OUTPATIENT_CLINIC_OR_DEPARTMENT_OTHER): Payer: Self-pay

## 2018-02-25 ENCOUNTER — Other Ambulatory Visit: Payer: Self-pay

## 2018-03-04 ENCOUNTER — Encounter (HOSPITAL_BASED_OUTPATIENT_CLINIC_OR_DEPARTMENT_OTHER)
Admission: RE | Admit: 2018-03-04 | Discharge: 2018-03-04 | Disposition: A | Discharge status: Home / Self Care | Payer: Medicaid Other | Source: Ambulatory Visit | Attending: Orthopedic Surgery | Admitting: Orthopedic Surgery

## 2018-03-04 DIAGNOSIS — F209 Schizophrenia, unspecified: Secondary | ICD-10-CM | POA: Diagnosis not present

## 2018-03-04 DIAGNOSIS — Z79899 Other long term (current) drug therapy: Secondary | ICD-10-CM | POA: Diagnosis not present

## 2018-03-04 DIAGNOSIS — F319 Bipolar disorder, unspecified: Secondary | ICD-10-CM | POA: Diagnosis not present

## 2018-03-04 DIAGNOSIS — F17210 Nicotine dependence, cigarettes, uncomplicated: Secondary | ICD-10-CM | POA: Diagnosis not present

## 2018-03-04 DIAGNOSIS — J449 Chronic obstructive pulmonary disease, unspecified: Secondary | ICD-10-CM | POA: Diagnosis not present

## 2018-03-04 DIAGNOSIS — M75121 Complete rotator cuff tear or rupture of right shoulder, not specified as traumatic: Secondary | ICD-10-CM | POA: Diagnosis present

## 2018-03-04 DIAGNOSIS — B1920 Unspecified viral hepatitis C without hepatic coma: Secondary | ICD-10-CM | POA: Diagnosis not present

## 2018-03-04 DIAGNOSIS — M7541 Impingement syndrome of right shoulder: Secondary | ICD-10-CM | POA: Diagnosis not present

## 2018-03-04 DIAGNOSIS — F419 Anxiety disorder, unspecified: Secondary | ICD-10-CM | POA: Diagnosis not present

## 2018-03-04 LAB — COMPREHENSIVE METABOLIC PANEL
ALK PHOS: 64 U/L (ref 38–126)
ALT: 22 U/L (ref 0–44)
ANION GAP: 10 (ref 5–15)
AST: 22 U/L (ref 15–41)
Albumin: 3.9 g/dL (ref 3.5–5.0)
BILIRUBIN TOTAL: 0.4 mg/dL (ref 0.3–1.2)
BUN: 5 mg/dL — ABNORMAL LOW (ref 6–20)
CALCIUM: 9.5 mg/dL (ref 8.9–10.3)
CO2: 30 mmol/L (ref 22–32)
CREATININE: 0.94 mg/dL (ref 0.61–1.24)
Chloride: 102 mmol/L (ref 98–111)
Glucose, Bld: 92 mg/dL (ref 70–99)
Potassium: 4.3 mmol/L (ref 3.5–5.1)
SODIUM: 142 mmol/L (ref 135–145)
TOTAL PROTEIN: 6.9 g/dL (ref 6.5–8.1)

## 2018-03-05 ENCOUNTER — Ambulatory Visit (HOSPITAL_BASED_OUTPATIENT_CLINIC_OR_DEPARTMENT_OTHER): Payer: Medicaid Other | Admitting: Anesthesiology

## 2018-03-05 ENCOUNTER — Encounter (HOSPITAL_BASED_OUTPATIENT_CLINIC_OR_DEPARTMENT_OTHER)
Admission: RE | Disposition: A | Discharge status: Home / Self Care | Payer: Self-pay | Source: Ambulatory Visit | Attending: Orthopedic Surgery

## 2018-03-05 ENCOUNTER — Encounter (HOSPITAL_BASED_OUTPATIENT_CLINIC_OR_DEPARTMENT_OTHER): Payer: Self-pay | Admitting: *Deleted

## 2018-03-05 ENCOUNTER — Ambulatory Visit (HOSPITAL_BASED_OUTPATIENT_CLINIC_OR_DEPARTMENT_OTHER)
Admission: RE | Admit: 2018-03-05 | Discharge: 2018-03-05 | Disposition: A | Discharge status: Home / Self Care | Payer: Medicaid Other | Source: Ambulatory Visit | Attending: Orthopedic Surgery | Admitting: Orthopedic Surgery

## 2018-03-05 ENCOUNTER — Other Ambulatory Visit: Payer: Self-pay

## 2018-03-05 DIAGNOSIS — M7541 Impingement syndrome of right shoulder: Secondary | ICD-10-CM | POA: Diagnosis not present

## 2018-03-05 DIAGNOSIS — M75121 Complete rotator cuff tear or rupture of right shoulder, not specified as traumatic: Secondary | ICD-10-CM | POA: Insufficient documentation

## 2018-03-05 DIAGNOSIS — F17210 Nicotine dependence, cigarettes, uncomplicated: Secondary | ICD-10-CM | POA: Insufficient documentation

## 2018-03-05 DIAGNOSIS — F419 Anxiety disorder, unspecified: Secondary | ICD-10-CM | POA: Diagnosis not present

## 2018-03-05 DIAGNOSIS — J449 Chronic obstructive pulmonary disease, unspecified: Secondary | ICD-10-CM | POA: Diagnosis not present

## 2018-03-05 DIAGNOSIS — B1920 Unspecified viral hepatitis C without hepatic coma: Secondary | ICD-10-CM | POA: Insufficient documentation

## 2018-03-05 DIAGNOSIS — F319 Bipolar disorder, unspecified: Secondary | ICD-10-CM | POA: Insufficient documentation

## 2018-03-05 DIAGNOSIS — Z79899 Other long term (current) drug therapy: Secondary | ICD-10-CM | POA: Insufficient documentation

## 2018-03-05 DIAGNOSIS — M75101 Unspecified rotator cuff tear or rupture of right shoulder, not specified as traumatic: Secondary | ICD-10-CM

## 2018-03-05 DIAGNOSIS — F209 Schizophrenia, unspecified: Secondary | ICD-10-CM | POA: Insufficient documentation

## 2018-03-05 HISTORY — DX: Unspecified rotator cuff tear or rupture of right shoulder, not specified as traumatic: M75.101

## 2018-03-05 HISTORY — PX: SHOULDER ARTHROSCOPY WITH ROTATOR CUFF REPAIR: SHX5685

## 2018-03-05 HISTORY — DX: Unspecified convulsions: R56.9

## 2018-03-05 SURGERY — ARTHROSCOPY, SHOULDER, WITH ROTATOR CUFF REPAIR
Anesthesia: General | Site: Shoulder | Laterality: Right

## 2018-03-05 MED ORDER — SODIUM CHLORIDE 0.9 % IR SOLN
Status: DC | PRN
  Administered 2018-03-05: 6000 mL

## 2018-03-05 MED ORDER — SENNA-DOCUSATE SODIUM 8.6-50 MG PO TABS: 2.0000 | ORAL_TABLET | Freq: Every day | ORAL | 1 refills | Status: DC

## 2018-03-05 MED ORDER — GLYCOPYRROLATE 0.2 MG/ML IJ SOLN
INTRAMUSCULAR | Status: DC | PRN
  Administered 2018-03-05: 0.2 mg via INTRAVENOUS

## 2018-03-05 MED ORDER — GABAPENTIN 300 MG PO CAPS
300.0000 mg | ORAL_CAPSULE | Freq: Once | ORAL | Status: AC
  Administered 2018-03-05: 300 mg via ORAL

## 2018-03-05 MED ORDER — ACETAMINOPHEN 500 MG PO TABS
ORAL_TABLET | ORAL | Status: AC
  Filled 2018-03-05: qty 2

## 2018-03-05 MED ORDER — MEPERIDINE HCL 25 MG/ML IJ SOLN: 6.2500 mg | INTRAMUSCULAR | Status: DC | PRN

## 2018-03-05 MED ORDER — DEXAMETHASONE SODIUM PHOSPHATE 10 MG/ML IJ SOLN: 8.0000 mg | Freq: Once | INTRAMUSCULAR | Status: DC

## 2018-03-05 MED ORDER — SODIUM CHLORIDE 0.9 % IV SOLN
INTRAVENOUS | Status: DC | PRN
  Administered 2018-03-05: 50 ug/min via INTRAVENOUS

## 2018-03-05 MED ORDER — EPHEDRINE 5 MG/ML INJ
INTRAVENOUS | Status: AC
  Filled 2018-03-05: qty 10

## 2018-03-05 MED ORDER — ROPIVACAINE HCL 5 MG/ML IJ SOLN
INTRAMUSCULAR | Status: DC | PRN
  Administered 2018-03-05: 30 mL via PERINEURAL

## 2018-03-05 MED ORDER — ONDANSETRON HCL 4 MG PO TABS: 4.0000 mg | ORAL_TABLET | Freq: Three times a day (TID) | ORAL | 0 refills | Status: DC | PRN

## 2018-03-05 MED ORDER — LACTATED RINGERS IV SOLN
INTRAVENOUS | Status: DC
  Administered 2018-03-05: 10:00:00 via INTRAVENOUS

## 2018-03-05 MED ORDER — MIDAZOLAM HCL 2 MG/2ML IJ SOLN
INTRAMUSCULAR | Status: AC
  Filled 2018-03-05: qty 2

## 2018-03-05 MED ORDER — OXYCODONE HCL 5 MG PO TABS: 5.0000 mg | ORAL_TABLET | ORAL | 0 refills | Status: DC | PRN

## 2018-03-05 MED ORDER — LIDOCAINE 2% (20 MG/ML) 5 ML SYRINGE
INTRAMUSCULAR | Status: DC | PRN
  Administered 2018-03-05: 50 mg via INTRAVENOUS

## 2018-03-05 MED ORDER — DEXAMETHASONE SODIUM PHOSPHATE 4 MG/ML IJ SOLN
INTRAMUSCULAR | Status: DC | PRN
  Administered 2018-03-05: 10 mg via INTRAVENOUS

## 2018-03-05 MED ORDER — EPHEDRINE SULFATE 50 MG/ML IJ SOLN
INTRAMUSCULAR | Status: DC | PRN
  Administered 2018-03-05: 10 mg via INTRAVENOUS

## 2018-03-05 MED ORDER — GABAPENTIN 300 MG PO CAPS
ORAL_CAPSULE | ORAL | Status: AC
  Filled 2018-03-05: qty 1

## 2018-03-05 MED ORDER — DEXAMETHASONE SODIUM PHOSPHATE 10 MG/ML IJ SOLN
INTRAMUSCULAR | Status: AC
  Filled 2018-03-05: qty 1

## 2018-03-05 MED ORDER — OXYCODONE HCL 5 MG/5ML PO SOLN: 5.0000 mg | Freq: Once | ORAL | Status: DC | PRN

## 2018-03-05 MED ORDER — HYDROMORPHONE HCL 1 MG/ML IJ SOLN: 0.2500 mg | INTRAMUSCULAR | Status: DC | PRN

## 2018-03-05 MED ORDER — OXYCODONE HCL 5 MG PO TABS: 5.0000 mg | ORAL_TABLET | Freq: Once | ORAL | Status: DC | PRN

## 2018-03-05 MED ORDER — ONDANSETRON HCL 4 MG/2ML IJ SOLN
INTRAMUSCULAR | Status: AC
  Filled 2018-03-05: qty 2

## 2018-03-05 MED ORDER — SCOPOLAMINE 1 MG/3DAYS TD PT72: 1.0000 | MEDICATED_PATCH | Freq: Once | TRANSDERMAL | Status: DC | PRN

## 2018-03-05 MED ORDER — CEFAZOLIN SODIUM-DEXTROSE 2-4 GM/100ML-% IV SOLN
INTRAVENOUS | Status: AC
  Filled 2018-03-05: qty 100

## 2018-03-05 MED ORDER — ACETAMINOPHEN 500 MG PO TABS
1000.0000 mg | ORAL_TABLET | Freq: Once | ORAL | Status: AC
  Administered 2018-03-05: 1000 mg via ORAL

## 2018-03-05 MED ORDER — FENTANYL CITRATE (PF) 100 MCG/2ML IJ SOLN
50.0000 ug | INTRAMUSCULAR | Status: DC | PRN
  Administered 2018-03-05: 100 ug via INTRAVENOUS

## 2018-03-05 MED ORDER — ONDANSETRON HCL 4 MG/2ML IJ SOLN
INTRAMUSCULAR | Status: DC | PRN
  Administered 2018-03-05: 4 mg via INTRAVENOUS

## 2018-03-05 MED ORDER — MIDAZOLAM HCL 2 MG/2ML IJ SOLN
1.0000 mg | INTRAMUSCULAR | Status: DC | PRN
  Administered 2018-03-05: 2 mg via INTRAVENOUS

## 2018-03-05 MED ORDER — GLYCOPYRROLATE PF 0.2 MG/ML IJ SOSY
PREFILLED_SYRINGE | INTRAMUSCULAR | Status: AC
  Filled 2018-03-05: qty 1

## 2018-03-05 MED ORDER — LIDOCAINE 2% (20 MG/ML) 5 ML SYRINGE
INTRAMUSCULAR | Status: AC
  Filled 2018-03-05: qty 5

## 2018-03-05 MED ORDER — PROMETHAZINE HCL 25 MG/ML IJ SOLN: 6.2500 mg | INTRAMUSCULAR | Status: DC | PRN

## 2018-03-05 MED ORDER — FENTANYL CITRATE (PF) 100 MCG/2ML IJ SOLN
INTRAMUSCULAR | Status: AC
  Filled 2018-03-05: qty 2

## 2018-03-05 MED ORDER — BACLOFEN 10 MG PO TABS: 10.0000 mg | ORAL_TABLET | Freq: Three times a day (TID) | ORAL | 0 refills | Status: AC

## 2018-03-05 MED ORDER — PHENYLEPHRINE HCL 10 MG/ML IJ SOLN
INTRAMUSCULAR | Status: AC
  Filled 2018-03-05: qty 1

## 2018-03-05 MED ORDER — CEFAZOLIN SODIUM-DEXTROSE 2-4 GM/100ML-% IV SOLN
2.0000 g | INTRAVENOUS | Status: AC
  Administered 2018-03-05: 2 g via INTRAVENOUS

## 2018-03-05 MED ORDER — PROPOFOL 10 MG/ML IV BOLUS
INTRAVENOUS | Status: DC | PRN
  Administered 2018-03-05: 300 mg via INTRAVENOUS

## 2018-03-05 SURGICAL SUPPLY — 64 items
BLADE CUTTER GATOR 3.5 (BLADE) IMPLANT
BLADE GREAT WHITE 4.2 (BLADE) IMPLANT
BLADE GREAT WHITE 4.2MM (BLADE)
BLADE SURG 15 STRL LF DISP TIS (BLADE) IMPLANT
BLADE SURG 15 STRL SS (BLADE)
BUR OVAL 6.0 (BURR) IMPLANT
CANNULA 5.75X71 LONG (CANNULA) ×3 IMPLANT
CANNULA TWIST IN 8.25X7CM (CANNULA) ×3 IMPLANT
CLOSURE STERI-STRIP 1/2X4 (GAUZE/BANDAGES/DRESSINGS) ×1
CLSR STERI-STRIP ANTIMIC 1/2X4 (GAUZE/BANDAGES/DRESSINGS) ×2 IMPLANT
DECANTER SPIKE VIAL GLASS SM (MISCELLANEOUS) IMPLANT
DRAPE IMP U-DRAPE 54X76 (DRAPES) ×3 IMPLANT
DRAPE INCISE IOBAN 66X45 STRL (DRAPES) ×3 IMPLANT
DRAPE SHOULDER BEACH CHAIR (DRAPES) ×3 IMPLANT
DRAPE SURG 17X23 STRL (DRAPES) ×3 IMPLANT
DRAPE U-SHAPE 47X51 STRL (DRAPES) ×3 IMPLANT
DRSG PAD ABDOMINAL 8X10 ST (GAUZE/BANDAGES/DRESSINGS) ×3 IMPLANT
DURAPREP 26ML APPLICATOR (WOUND CARE) ×3 IMPLANT
ELECT REM PT RETURN 9FT ADLT (ELECTROSURGICAL)
ELECTRODE REM PT RTRN 9FT ADLT (ELECTROSURGICAL) IMPLANT
FIBERSTICK 2 (SUTURE) IMPLANT
GAUZE SPONGE 4X4 12PLY STRL (GAUZE/BANDAGES/DRESSINGS) ×3 IMPLANT
GLOVE BIO SURGEON STRL SZ8 (GLOVE) ×3 IMPLANT
GLOVE BIOGEL M STRL SZ7.5 (GLOVE) ×3 IMPLANT
GLOVE BIOGEL PI IND STRL 8 (GLOVE) ×3 IMPLANT
GLOVE BIOGEL PI INDICATOR 8 (GLOVE) ×6
GLOVE ORTHO TXT STRL SZ7.5 (GLOVE) ×3 IMPLANT
GOWN STRL REUS W/ TWL LRG LVL3 (GOWN DISPOSABLE) ×1 IMPLANT
GOWN STRL REUS W/ TWL XL LVL3 (GOWN DISPOSABLE) ×3 IMPLANT
GOWN STRL REUS W/TWL LRG LVL3 (GOWN DISPOSABLE) ×2
GOWN STRL REUS W/TWL XL LVL3 (GOWN DISPOSABLE) ×6
IMMOBILIZER SHOULDER FOAM XLGE (SOFTGOODS) IMPLANT
IMPL SPEEDBRIDGE KIT (Orthopedic Implant) ×1 IMPLANT
IMPLANT SPEEDBRIDGE KIT (Orthopedic Implant) ×3 IMPLANT
LASSO 90 CVE QUICKPAS (DISPOSABLE) IMPLANT
MANIFOLD NEPTUNE II (INSTRUMENTS) ×3 IMPLANT
NEEDLE SCORPION MULTI FIRE (NEEDLE) ×3 IMPLANT
PACK ARTHROSCOPY DSU (CUSTOM PROCEDURE TRAY) ×3 IMPLANT
PACK BASIN DAY SURGERY FS (CUSTOM PROCEDURE TRAY) ×3 IMPLANT
PROBE BIPOLAR ATHRO 135MM 90D (MISCELLANEOUS) ×3 IMPLANT
SHEET MEDIUM DRAPE 40X70 STRL (DRAPES) ×3 IMPLANT
SLEEVE SCD COMPRESS KNEE MED (MISCELLANEOUS) ×3 IMPLANT
SLING ARM FOAM STRAP LRG (SOFTGOODS) ×3 IMPLANT
SLING ARM IMMOBILIZER LRG (SOFTGOODS) IMPLANT
SLING ARM IMMOBILIZER MED (SOFTGOODS) IMPLANT
SLING ARM MED ADULT FOAM STRAP (SOFTGOODS) IMPLANT
SLING ARM XL FOAM STRAP (SOFTGOODS) IMPLANT
SUPPORT WRAP ARM LG (MISCELLANEOUS) ×3 IMPLANT
SUT FIBERWIRE #2 38 T-5 BLUE (SUTURE)
SUT MNCRL AB 4-0 PS2 18 (SUTURE) ×3 IMPLANT
SUT PDS AB 0 CT 36 (SUTURE) ×3 IMPLANT
SUT PDS AB 1 CT  36 (SUTURE)
SUT PDS AB 1 CT 36 (SUTURE) IMPLANT
SUT TIGER TAPE 7 IN WHITE (SUTURE) IMPLANT
SUT VIC AB 3-0 SH 27 (SUTURE)
SUT VIC AB 3-0 SH 27X BRD (SUTURE) IMPLANT
SUTURE FIBERWR #2 38 T-5 BLUE (SUTURE) IMPLANT
TAPE FIBER 2MM 7IN #2 BLUE (SUTURE) IMPLANT
TOWEL GREEN STERILE FF (TOWEL DISPOSABLE) ×3 IMPLANT
TOWEL OR NON WOVEN STRL DISP B (DISPOSABLE) IMPLANT
TUBE CONNECTING 20'X1/4 (TUBING) ×1
TUBE CONNECTING 20X1/4 (TUBING) ×2 IMPLANT
TUBING ARTHRO INFLOW-ONLY STRL (TUBING) IMPLANT
WATER STERILE IRR 1000ML POUR (IV SOLUTION) ×3 IMPLANT

## 2018-03-05 NOTE — Transfer of Care (Signed)
Immediate Anesthesia Transfer of Care Note  Patient: Victor Rowe  Procedure(s) Performed: RIGHT SHOULDER ARTHROSCOPY WITH DEBRIDEMENT, ACROIOPLASTY, ROTATOR CUFF REPAIR (Right Shoulder)  Patient Location: PACU  Anesthesia Type:General and Regional  Level of Consciousness: awake and sedated  Airway & Oxygen Therapy: Patient Spontanous Breathing and Patient connected to face mask oxygen  Post-op Assessment: Report given to RN and Post -op Vital signs reviewed and stable  Post vital signs: Reviewed and stable  Last Vitals:  Vitals Value Taken Time  BP 142/102 03/05/2018  3:29 PM  Temp    Pulse 78 03/05/2018  3:30 PM  Resp 18 03/05/2018  3:30 PM  SpO2 100 % 03/05/2018  3:30 PM  Vitals shown include unvalidated device data.  Last Pain:  Vitals:   03/05/18 0942  TempSrc: Oral         Complications: No apparent anesthesia complications

## 2018-03-05 NOTE — Anesthesia Procedure Notes (Signed)
Anesthesia Regional Block: Interscalene brachial plexus block   Pre-Anesthetic Checklist: ,, timeout performed, Correct Patient, Correct Site, Correct Laterality, Correct Procedure, Correct Position, site marked, Risks and benefits discussed,  Surgical consent,  Pre-op evaluation,  At surgeon's request and post-op pain management  Laterality: Right  Prep: chloraprep       Needles:  Injection technique: Single-shot  Needle Type: Stimiplex     Needle Length: 9cm  Needle Gauge: 21     Additional Needles:   Procedures:,,,, ultrasound used (permanent image in chart),,,,  Narrative:  Start time: 03/05/2018 11:58 AM End time: 03/05/2018 12:03 PM Injection made incrementally with aspirations every 5 mL.  Performed by: Personally  Anesthesiologist: Lynda Rainwater, MD

## 2018-03-05 NOTE — Anesthesia Postprocedure Evaluation (Signed)
Anesthesia Post Note  Patient: Victor Rowe  Procedure(s) Performed: RIGHT SHOULDER ARTHROSCOPY WITH DEBRIDEMENT, ACROIOPLASTY, ROTATOR CUFF REPAIR (Right Shoulder)     Patient location during evaluation: PACU Anesthesia Type: General Level of consciousness: awake and alert Pain management: pain level controlled Vital Signs Assessment: post-procedure vital signs reviewed and stable Respiratory status: spontaneous breathing, nonlabored ventilation and respiratory function stable Cardiovascular status: blood pressure returned to baseline and stable Postop Assessment: no apparent nausea or vomiting Anesthetic complications: no    Last Vitals:  Vitals:   03/05/18 1600 03/05/18 1610  BP: 127/83 (!) 141/85  Pulse: 61 64  Resp: 15 20  Temp:  36.4 C  SpO2: 100% 99%    Last Pain:  Vitals:   03/05/18 0942  TempSrc: Oral                 Lynda Rainwater

## 2018-03-05 NOTE — Progress Notes (Signed)
Assisted Dr. Miller with right, ultrasound guided, interscalene  block. Side rails up, monitors on throughout procedure. See vital signs in flow sheet. Tolerated Procedure well. 

## 2018-03-05 NOTE — H&P (Signed)
PREOPERATIVE H&P  Chief Complaint: Right shoulder pain  HPI: Victor Rowe is a 57 y.o. male who presents for preoperative history and physical with a diagnosis of right rotator cuff tear. Symptoms are rated as moderate to severe, and have been worsening.  This is significantly impairing activities of daily living.  He has elected for surgical management.  Injury occurred after picking up a pipe.  Pain rated 10/10.  Nighttime is severe pain.  Difficulty with lifting.  He is afraid of needles and does not want injections.  Failed anti-inflammatories.  Past Medical History:  Diagnosis Date  . Anxiety   . Asthma   . COPD (chronic obstructive pulmonary disease) ([place])   . Depression   . Hepatitis    Hep C  Pt states he has taken a 12 week medication for disease  . Seizure ([place]) 11/2017   History reviewed. No pertinent surgical history. Social History   Socioeconomic History  . Marital status: Single    Spouse name: Not on file  . Number of children: Not on file  . Years of education: Not on file  . Highest education level: Not on file  Occupational History  . Not on file  Social Needs  . Financial resource strain: Not on file  . Food insecurity:    Worry: Not on file    Inability: Not on file  . Transportation needs:    Medical: Not on file    Non-medical: Not on file  Tobacco Use  . Smoking status: Current Some Day Smoker    Packs/day: 0.25    Types: Cigarettes  . Smokeless tobacco: Never Used  . Tobacco comment: pt states he quit 3 days ago  Substance and Sexual Activity  . Alcohol use: Not Currently    Comment: pt states has not had a drink in one month  . Drug use: Yes    Types: Marijuana    Comment: currently uses everyday  . Sexual activity: Not on file  Lifestyle  . Physical activity:    Days per week: Not on file    Minutes per session: Not on file  . Stress: Not on file  Relationships  . Social connections:    Talks on phone: Not on file    Gets together:  Not on file    Attends religious service: Not on file    Active member of club or organization: Not on file    Attends meetings of clubs or organizations: Not on file    Relationship status: Not on file  Other Topics Concern  . Not on file  Social History Narrative  . Not on file   Family History  Problem Relation Age of Onset  . Alcoholism Father    No Known Allergies Prior to Admission medications   Medication Sig Start Date End Date Taking? Authorizing Provider  budesonide-formoterol (SYMBICORT) 80-4.5 MCG/ACT inhaler Inhale 2 puffs into the lungs 2 (two) times daily.   Yes [provider]  traZODone (DESYREL) 150 MG tablet Take 1 tablet (150 mg total) by mouth at bedtime. 03/31/15  Yes Kerrie Buffalo, NP  albuterol (PROVENTIL HFA;VENTOLIN HFA) 108 (90 Base) MCG/ACT inhaler Inhale 1-2 puffs into the lungs every 6 (six) hours as needed for wheezing or shortness of breath. 01/19/16   Charlynne Cousins, MD  ARIPiprazole (ABILIFY) 5 MG tablet Take 1 tablet (5 mg total) by mouth at bedtime. 03/31/15   Kerrie Buffalo, NP  chlordiazePOXIDE (LIBRIUM) 25 MG capsule 50mg  PO TID x 1D,  then 25-50mg  PO BID X 1D, then 25-50mg  PO QD X 1D 11/29/17   Dorie Rank, MD     Positive ROS: All other systems have been reviewed and were otherwise negative with the exception of those mentioned in the HPI and as above.  Physical Exam: General: Alert, no acute distress Cardiovascular: No pedal edema Respiratory: No cyanosis, no use of accessory musculature GI: No organomegaly, abdomen is soft and non-tender Skin: No lesions in the area of chief complaint Neurologic: Sensation intact distally Psychiatric: Patient is competent for consent with normal mood and affect Lymphatic: No axillary or cervical lymphadenopathy  MUSCULOSKELETAL: Right shoulder active motion 0 to 120 degrees, external rotation to 30 degrees, negative Popeye sign, no pain over the [facility] joint.  Assessment: Right rotator cuff  tear with os acromiale   Plan: Plan for Procedure(s): RIGHT SHOULDER ARTHROSCOPY WITH DEBRIDEMENT, ACROIOPLASTY, ROTATOR CUFF REPAIR  The risks benefits and alternatives were discussed with the patient including but not limited to the risks of nonoperative treatment, versus surgical intervention including infection, bleeding, nerve injury,  blood clots, cardiopulmonary complications, morbidity, mortality, among others, and they were willing to proceed.  Also discussed the risks for recurrent tear, incomplete relief of symptoms, destabilization of the os acromiale.    Johnny Bridge, MD Cell [phone]   03/05/2018 1:00 PM

## 2018-03-05 NOTE — Op Note (Signed)
03/05/2018  3:18 PM  PATIENT:  Victor Rowe    PRE-OPERATIVE DIAGNOSIS: Right full-thickness rotator cuff tear with impingement and os acromiale with labral tear  POST-OPERATIVE DIAGNOSIS:  Same  PROCEDURE: Right shoulder arthroscopy, extensive debridement including superior labrum, anterior labrum, supraspinatus tendon, with light acromioplasty, and supraspinatus repair  SURGEON:  Johnny Bridge, MD  PHYSICIAN ASSISTANT: Joya Gaskins, OPA-C, present and scrubbed throughout the case, critical for completion in a timely fashion, and for retraction, instrumentation, and closure.  ANESTHESIA:   General with regional block  PREOPERATIVE INDICATIONS:  Victor Rowe is a  57 y.o. male who had a right rotator cuff tear and elected for surgical management.  The risks benefits and alternatives were discussed with the patient preoperatively including but not limited to the risks of infection, bleeding, nerve injury, cardiopulmonary complications, the need for revision surgery, among others, and the patient was willing to proceed.  ESTIMATED BLOOD LOSS: Minimal  OPERATIVE IMPLANTS: Arthrex bio composite 4.75 mm swivel lock x4, 2 for the medial row, and 2 for the lateral row.  I used an inverted fiber tape in a horizontal mattress configuration off of the medial row, and secured them laterally, the posterior limb of the anterior fiber tape was somewhat loose at the completion of the procedure, so I used the rescue stitch from the anterior swivel lock to tighten tension on the posterior limb of the anterior fiber tape, there was a slight dog ear posteriorly, however I had good apposition of the tendon to the bone across the entirety of what was a reasonably large tear.  OPERATIVE FINDINGS: The shoulder had nearly full motion during examination under anesthesia, may be just a very slight release with manipulation.  The articular cartilage of the glenoid and humerus were normal.  The superior labrum was  normal, with the exception of some slight fraying which was debrided.  The anterior labrum had minimal fraying.  The supraspinatus had complex tearing.  The biceps tendon and subscapularis were intact and the biceps pulley was intact.  The tear was fairly large, and with a lot of redundant type tissue.  The tendon was fairly mobile and quality reasonably good, the bone quality was excellent, there was a fairly sizable subacromial office, I did not destabilize it medially, and I performed a slight in situ acromioplasty.  It was too big for removal.  OPERATIVE PROCEDURE: The patient was brought to the operating room and placed in the supine position.  General anesthesia was administered.  IV antibiotics were given.  He was placed in the beachchair position and timeout performed.  The arm was manipulated and full motion achieved with slight release at the very top.  The upper extremity was then prepped and draped in usual sterile fashion.  Diagnostic arthroscopy was carried out with the above named findings.  I used the arthroscopic shaver to debride a small area of the anterior labrum as well as the superior labrum and also the undersurface of the supraspinatus.  I went to the subacromial space, performed a complete bursectomy, and evaluated the CA ligament.  There was significant fraying.  I did not fully released the CA ligament, but rather used a bur to remove the anterior lip off of the acromion, I debated whether to remove the os acromiale, but it was fairly large and I did not want to remove that much bone, so this was left in place.  The [facility] joint was not violated.  I then placed 2 medial anchors, as  well as cannulas, and passed the fiber tape with a scorpion suture passer.  These were brought laterally into swivel locks, and tension, and the tendon re-opposed nicely.  Initially I had a fair amount of tension on the posterior limb of the anterior tape, and it was almost folding the rotator cuff on itself,  so I released a little bit of tension.  Ultimately when the cuff was completely reduced, I did not have enough tension on that limb, and so I augmented the tension using the rescue stitch from the anterior lateral swivel lock.  This was able to reapply tension to the limb to achieve better compression of the cuff.  There was a slight dog ear posteriorly, but not of major consequence, this was decreased in bulk with the ArthroCare wand.  I had good apposition of the cuff across the entirety of the surface.  The instruments were removed, the portals closed with Monocryl followed by Steri-Strips and sterile gauze.  He was awakened and returned to the PACU in stable and satisfactory condition.  There were no complications and he tolerated the procedure well.

## 2018-03-05 NOTE — Anesthesia Procedure Notes (Signed)
Procedure Name: LMA Insertion Performed by: Sevyn Paredez W, CRNA Pre-anesthesia Checklist: Patient identified, Emergency Drugs available, Suction available and Patient being monitored Patient Re-evaluated:Patient Re-evaluated prior to induction Oxygen Delivery Method: Circle system utilized Preoxygenation: Pre-oxygenation with 100% oxygen Induction Type: IV induction Ventilation: Mask ventilation without difficulty LMA: LMA inserted LMA Size: 4.0 Number of attempts: 1 Placement Confirmation: positive ETCO2 Tube secured with: Tape Dental Injury: Teeth and Oropharynx as per pre-operative assessment        

## 2018-03-05 NOTE — Discharge Instructions (Signed)
Post Anesthesia Home Care Instructions  Activity: Get plenty of rest for the remainder of the day. A responsible individual must stay with you for 24 hours following the procedure.  For the next 24 hours, DO NOT: -Drive a car -Paediatric nurse -Drink alcoholic beverages -Take any medication unless instructed by your physician -Make any legal decisions or sign important papers.  Meals: Start with liquid foods such as gelatin or soup. Progress to regular foods as tolerated. Avoid greasy, spicy, heavy foods. If nausea and/or vomiting occur, drink only clear liquids until the nausea and/or vomiting subsides. Call your physician if vomiting continues.  Special Instructions/Symptoms: Your throat may feel dry or sore from the anesthesia or the breathing tube placed in your throat during surgery. If this causes discomfort, gargle with warm salt water. The discomfort should disappear within 24 hours.  If you had a scopolamine patch placed behind your ear for the management of post- operative nausea and/or vomiting:  1. The medication in the patch is effective for 72 hours, after which it should be removed.  Wrap patch in a tissue and discard in the trash. Wash hands thoroughly with soap and water. 2. You may remove the patch earlier than 72 hours if you experience unpleasant side effects which may include dry mouth, dizziness or visual disturbances. 3. Avoid touching the patch. Wash your hands with soap and water after contact with the patch.   Regional Anesthesia Blocks  1. Numbness or the inability to move the "blocked" extremity may last from 3-48 hours after placement. The length of time depends on the medication injected and your individual response to the medication. If the numbness is not going away after 48 hours, call your surgeon.  2. The extremity that is blocked will need to be protected until the numbness is gone and the  Strength has returned. Because you cannot feel it, you will  need to take extra care to avoid injury. Because it may be weak, you may have difficulty moving it or using it. You may not know what position it is in without looking at it while the block is in effect.  3. For blocks in the legs and feet, returning to weight bearing and walking needs to be done carefully. You will need to wait until the numbness is entirely gone and the strength has returned. You should be able to move your leg and foot normally before you try and bear weight or walk. You will need someone to be with you when you first try to ensure you do not fall and possibly risk injury.  4. Bruising and tenderness at the needle site are common side effects and will resolve in a few days.  5. Persistent numbness or new problems with movement should be communicated to the surgeon or the [facility] [phone] [place] [phone]).Diet: As you were doing prior to hospitalization   Shower:  May shower but keep the wounds dry, use an occlusive plastic wrap, NO SOAKING IN TUB.  If the bandage gets wet, change with a clean dry gauze.  If you have a splint on, leave the splint in place and keep the splint dry with a plastic bag.  Dressing:  You may change your dressing 3-5 days after surgery, unless you have a splint.  If you have a splint, then just leave the splint in place and we will change your bandages during your first follow-up appointment.    If you had hand or foot  surgery, we will plan to remove your stitches in about 2 weeks in the office.  For all other surgeries, there are sticky tapes (steri-strips) on your wounds and all the stitches are absorbable.  Leave the steri-strips in place when changing your dressings, they will peel off with time, usually 2-3 weeks.  Activity:  Increase activity slowly as tolerated, but follow the weight bearing instructions below.  The rules on driving is that you can not be taking narcotics while you drive, and you must feel  in control of the vehicle.    Weight Bearing:   Sling at all times except hygeine.    To prevent constipation: you may use a stool softener such as -  Colace (over the counter) 100 mg by mouth twice a day  Drink plenty of fluids (prune juice may be helpful) and high fiber foods Miralax (over the counter) for constipation as needed.    Itching:  If you experience itching with your medications, try taking only a single pain pill, or even half a pain pill at a time.  You may take up to 10 pain pills per day, and you can also use benadryl over the counter for itching or also to help with sleep.   Precautions:  If you experience chest pain or shortness of breath - call 911 immediately for transfer to the hospital emergency department!!  If you develop a fever greater that 101 F, purulent drainage from wound, increased redness or drainage from wound, or calf pain -- Call the office at [phone]                                                Follow- Up Appointment:  Please call for an appointment to be seen in 2 weeks [facility] - [phone]

## 2018-03-05 NOTE — Anesthesia Preprocedure Evaluation (Signed)
Anesthesia Evaluation  Patient identified by MRN, date of birth, ID band Patient awake    Reviewed: Allergy & Precautions, NPO status , Patient's Chart, lab work & pertinent test results  Airway Mallampati: II  TM Distance: >3 FB Neck ROM: Full    Dental no notable dental hx.    Pulmonary neg pulmonary ROS, asthma , COPD, Current Smoker,    Pulmonary exam normal breath sounds clear to auscultation       Cardiovascular negative cardio ROS Normal cardiovascular exam Rhythm:Regular Rate:Normal     Neuro/Psych Anxiety Depression Bipolar Disorder Schizophrenia negative neurological ROS  negative psych ROS   GI/Hepatic negative GI ROS, Neg liver ROS, (+) Hepatitis -, C  Endo/Other  negative endocrine ROS  Renal/GU negative Renal ROS  negative genitourinary   Musculoskeletal negative musculoskeletal ROS (+)   Abdominal   Peds negative pediatric ROS (+)  Hematology negative hematology ROS (+)   Anesthesia Other Findings   Reproductive/Obstetrics negative OB ROS                             Anesthesia Physical Anesthesia Plan  ASA: III  Anesthesia Plan: General   Post-op Pain Management:  Regional for Post-op pain   Induction: Intravenous  PONV Risk Score and Plan: 1 and Ondansetron  Airway Management Planned: LMA  Additional Equipment:   Intra-op Plan:   Post-operative Plan: Extubation in OR  Informed Consent: I have reviewed the patients History and Physical, chart, labs and discussed the procedure including the risks, benefits and alternatives for the proposed anesthesia with the patient or authorized representative who has indicated his/her understanding and acceptance.   Dental advisory given  Plan Discussed with: CRNA  Anesthesia Plan Comments:         Anesthesia Quick Evaluation

## 2018-03-06 ENCOUNTER — Encounter (HOSPITAL_BASED_OUTPATIENT_CLINIC_OR_DEPARTMENT_OTHER): Payer: Self-pay | Admitting: Orthopedic Surgery

## 2019-01-01 ENCOUNTER — Encounter (HOSPITAL_COMMUNITY): Payer: Self-pay

## 2019-01-01 ENCOUNTER — Observation Stay (HOSPITAL_COMMUNITY)
Admit: 2019-01-01 | Discharge: 2019-01-01 | Disposition: A | Discharge status: Home / Self Care | Payer: Medicaid Other | Attending: Internal Medicine | Admitting: Internal Medicine

## 2019-01-01 ENCOUNTER — Observation Stay (HOSPITAL_COMMUNITY): Payer: Medicaid Other

## 2019-01-01 ENCOUNTER — Other Ambulatory Visit: Payer: Self-pay

## 2019-01-01 ENCOUNTER — Observation Stay (HOSPITAL_BASED_OUTPATIENT_CLINIC_OR_DEPARTMENT_OTHER): Payer: Medicaid Other

## 2019-01-01 ENCOUNTER — Observation Stay (HOSPITAL_COMMUNITY)
Admission: EM | Admit: 2019-01-01 | Discharge: 2019-01-02 | Disposition: A | Discharge status: Home / Self Care | Payer: Medicaid Other | Attending: Internal Medicine | Admitting: Internal Medicine

## 2019-01-01 DIAGNOSIS — F1410 Cocaine abuse, uncomplicated: Secondary | ICD-10-CM | POA: Insufficient documentation

## 2019-01-01 DIAGNOSIS — Z1159 Encounter for screening for other viral diseases: Secondary | ICD-10-CM | POA: Diagnosis not present

## 2019-01-01 DIAGNOSIS — M6282 Rhabdomyolysis: Secondary | ICD-10-CM | POA: Insufficient documentation

## 2019-01-01 DIAGNOSIS — J449 Chronic obstructive pulmonary disease, unspecified: Secondary | ICD-10-CM | POA: Diagnosis not present

## 2019-01-01 DIAGNOSIS — Z7951 Long term (current) use of inhaled steroids: Secondary | ICD-10-CM | POA: Diagnosis not present

## 2019-01-01 DIAGNOSIS — F17210 Nicotine dependence, cigarettes, uncomplicated: Secondary | ICD-10-CM | POA: Insufficient documentation

## 2019-01-01 DIAGNOSIS — R569 Unspecified convulsions: Secondary | ICD-10-CM | POA: Insufficient documentation

## 2019-01-01 DIAGNOSIS — R9431 Abnormal electrocardiogram [ECG] [EKG]: Secondary | ICD-10-CM | POA: Diagnosis not present

## 2019-01-01 DIAGNOSIS — E860 Dehydration: Secondary | ICD-10-CM | POA: Diagnosis present

## 2019-01-01 DIAGNOSIS — F250 Schizoaffective disorder, bipolar type: Secondary | ICD-10-CM | POA: Insufficient documentation

## 2019-01-01 DIAGNOSIS — R55 Syncope and collapse: Secondary | ICD-10-CM | POA: Diagnosis not present

## 2019-01-01 DIAGNOSIS — F1910 Other psychoactive substance abuse, uncomplicated: Secondary | ICD-10-CM | POA: Diagnosis present

## 2019-01-01 DIAGNOSIS — K7460 Unspecified cirrhosis of liver: Secondary | ICD-10-CM | POA: Insufficient documentation

## 2019-01-01 DIAGNOSIS — F419 Anxiety disorder, unspecified: Secondary | ICD-10-CM | POA: Diagnosis not present

## 2019-01-01 DIAGNOSIS — B182 Chronic viral hepatitis C: Secondary | ICD-10-CM | POA: Diagnosis not present

## 2019-01-01 DIAGNOSIS — N179 Acute kidney failure, unspecified: Secondary | ICD-10-CM | POA: Insufficient documentation

## 2019-01-01 DIAGNOSIS — Z79899 Other long term (current) drug therapy: Secondary | ICD-10-CM | POA: Diagnosis not present

## 2019-01-01 LAB — CBC WITH DIFFERENTIAL/PLATELET
Abs Immature Granulocytes: 0.01 10*3/uL (ref 0.00–0.07)
Abs Immature Granulocytes: 0.01 10*3/uL (ref 0.00–0.07)
Basophils Absolute: 0.1 10*3/uL (ref 0.0–0.1)
Basophils Absolute: 0.1 10*3/uL (ref 0.0–0.1)
Basophils Relative: 1 %
Basophils Relative: 1 %
Eosinophils Absolute: 0.1 10*3/uL (ref 0.0–0.5)
Eosinophils Absolute: 0.1 10*3/uL (ref 0.0–0.5)
Eosinophils Relative: 1 %
Eosinophils Relative: 1 %
HCT: 39.6 % (ref 39.0–52.0)
HCT: 37.9 % — ABNORMAL LOW (ref 39.0–52.0)
Hemoglobin: 13.4 g/dL (ref 13.0–17.0)
Hemoglobin: 13 g/dL (ref 13.0–17.0)
Immature Granulocytes: 0 %
Immature Granulocytes: 0 %
Lymphocytes Relative: 32 %
Lymphocytes Relative: 24 %
Lymphs Abs: 1.9 10*3/uL (ref 0.7–4.0)
Lymphs Abs: 1.8 10*3/uL (ref 0.7–4.0)
MCH: 31.2 pg (ref 26.0–34.0)
MCH: 31.4 pg (ref 26.0–34.0)
MCHC: 33.8 g/dL (ref 30.0–36.0)
MCHC: 34.3 g/dL (ref 30.0–36.0)
MCV: 92.3 fL (ref 80.0–100.0)
MCV: 91.5 fL (ref 80.0–100.0)
Monocytes Absolute: 0.3 10*3/uL (ref 0.1–1.0)
Monocytes Absolute: 0.4 10*3/uL (ref 0.1–1.0)
Monocytes Relative: 6 %
Monocytes Relative: 5 %
Neutro Abs: 3.6 10*3/uL (ref 1.7–7.7)
Neutro Abs: 5.1 10*3/uL (ref 1.7–7.7)
Neutrophils Relative %: 60 %
Neutrophils Relative %: 69 %
Platelets: 177 10*3/uL (ref 150–400)
Platelets: 166 10*3/uL (ref 150–400)
RBC: 4.29 MIL/uL (ref 4.22–5.81)
RBC: 4.14 MIL/uL — ABNORMAL LOW (ref 4.22–5.81)
RDW: 13.8 % (ref 11.5–15.5)
RDW: 13.8 % (ref 11.5–15.5)
WBC: 6 10*3/uL (ref 4.0–10.5)
WBC: 7.3 10*3/uL (ref 4.0–10.5)
nRBC: 0 % (ref 0.0–0.2)
nRBC: 0 % (ref 0.0–0.2)

## 2019-01-01 LAB — RAPID URINE DRUG SCREEN, HOSP PERFORMED
Amphetamines: NOT DETECTED
Barbiturates: NOT DETECTED
Benzodiazepines: NOT DETECTED
Cocaine: POSITIVE — AB
Opiates: NOT DETECTED
Tetrahydrocannabinol: POSITIVE — AB

## 2019-01-01 LAB — BASIC METABOLIC PANEL
Anion gap: 12 (ref 5–15)
BUN: 14 mg/dL (ref 6–20)
CO2: 21 mmol/L — ABNORMAL LOW (ref 22–32)
Calcium: 9.1 mg/dL (ref 8.9–10.3)
Chloride: 107 mmol/L (ref 98–111)
Creatinine, Ser: 2.14 mg/dL — ABNORMAL HIGH (ref 0.61–1.24)
GFR calc Af Amer: 38 mL/min — ABNORMAL LOW (ref >60)
GFR calc non Af Amer: 33 mL/min — ABNORMAL LOW (ref >60)
Glucose, Bld: 136 mg/dL — ABNORMAL HIGH (ref 70–99)
Potassium: 3.9 mmol/L (ref 3.5–5.1)
Sodium: 140 mmol/L (ref 135–145)

## 2019-01-01 LAB — TROPONIN I (HIGH SENSITIVITY)
Troponin I (High Sensitivity): 5 ng/L (ref <18)
Troponin I (High Sensitivity): 4 ng/L (ref <18)

## 2019-01-01 LAB — COMPREHENSIVE METABOLIC PANEL
ALT: 24 U/L (ref 0–44)
AST: 29 U/L (ref 15–41)
Albumin: 3.7 g/dL (ref 3.5–5.0)
Alkaline Phosphatase: 50 U/L (ref 38–126)
Anion gap: 8 (ref 5–15)
BUN: 15 mg/dL (ref 6–20)
CO2: 22 mmol/L (ref 22–32)
Calcium: 8.9 mg/dL (ref 8.9–10.3)
Chloride: 111 mmol/L (ref 98–111)
Creatinine, Ser: 1.43 mg/dL — ABNORMAL HIGH (ref 0.61–1.24)
GFR calc Af Amer: >60 mL/min (ref >60)
GFR calc non Af Amer: 54 mL/min — ABNORMAL LOW (ref >60)
Glucose, Bld: 107 mg/dL — ABNORMAL HIGH (ref 70–99)
Potassium: 4.4 mmol/L (ref 3.5–5.1)
Sodium: 141 mmol/L (ref 135–145)
Total Bilirubin: 0.7 mg/dL (ref 0.3–1.2)
Total Protein: 6.2 g/dL — ABNORMAL LOW (ref 6.5–8.1)

## 2019-01-01 LAB — HIV ANTIBODY (ROUTINE TESTING W REFLEX): HIV Screen 4th Generation wRfx: NONREACTIVE

## 2019-01-01 LAB — SARS CORONAVIRUS 2 BY RT PCR (HOSPITAL ORDER, PERFORMED IN ~~LOC~~ HOSPITAL LAB): SARS Coronavirus 2: NEGATIVE

## 2019-01-01 LAB — TSH: TSH: 0.463 u[IU]/mL (ref 0.350–4.500)

## 2019-01-01 LAB — ECHOCARDIOGRAM COMPLETE: Height: 72 in

## 2019-01-01 LAB — CK
Total CK: 768 U/L — ABNORMAL HIGH (ref 49–397)
Total CK: 699 U/L — ABNORMAL HIGH (ref 49–397)
Total CK: 679 U/L — ABNORMAL HIGH (ref 49–397)

## 2019-01-01 LAB — MAGNESIUM: Magnesium: 2.1 mg/dL (ref 1.7–2.4)

## 2019-01-01 LAB — ETHANOL: Alcohol, Ethyl (B): 13 mg/dL — ABNORMAL HIGH (ref <10)

## 2019-01-01 MED ORDER — ACETAMINOPHEN 325 MG PO TABS: 650.0000 mg | ORAL_TABLET | Freq: Four times a day (QID) | ORAL | Status: DC | PRN

## 2019-01-01 MED ORDER — ALBUTEROL SULFATE (2.5 MG/3ML) 0.083% IN NEBU: 3.0000 mL | INHALATION_SOLUTION | Freq: Four times a day (QID) | RESPIRATORY_TRACT | Status: DC | PRN

## 2019-01-01 MED ORDER — ACETAMINOPHEN 650 MG RE SUPP: 650.0000 mg | Freq: Four times a day (QID) | RECTAL | Status: DC | PRN

## 2019-01-01 MED ORDER — THIAMINE HCL 100 MG/ML IJ SOLN
100.0000 mg | Freq: Every day | INTRAMUSCULAR | Status: DC
  Administered 2019-01-01 – 2019-01-02 (×2): 100 mg via INTRAVENOUS
  Filled 2019-01-01 (×2): qty 2

## 2019-01-01 MED ORDER — SODIUM CHLORIDE 0.9 % IV BOLUS
1000.0000 mL | Freq: Once | INTRAVENOUS | Status: AC
  Administered 2019-01-01: 1000 mL via INTRAVENOUS

## 2019-01-01 MED ORDER — SODIUM CHLORIDE 0.9 % IV SOLN
INTRAVENOUS | Status: AC
  Administered 2019-01-01 (×2): via INTRAVENOUS

## 2019-01-01 MED ORDER — SODIUM CHLORIDE 0.9 % IV BOLUS
1000.0000 mL | Freq: Once | INTRAVENOUS | Status: AC
  Administered 2019-01-01: 01:00:00 1000 mL via INTRAVENOUS

## 2019-01-01 MED ORDER — ENOXAPARIN SODIUM 40 MG/0.4ML ~~LOC~~ SOLN: 40.0000 mg | SUBCUTANEOUS | Status: DC

## 2019-01-01 NOTE — Progress Notes (Signed)
EEG complete - results pending 

## 2019-01-01 NOTE — H&P (Signed)
History and Physical    Victor Rowe VVO:[id number] DOB: 1961/04/13 DOA: 01/01/2019  PCP: Benito Mccreedy, MD  Patient coming from: Home.  History obtained from ER physician previous records and patient.  Chief Complaint: Loss of consciousness.  HPI: Victor Rowe is a 58 y.o. male with history of COPD tobacco abuse schizoaffective disorder hepatitis C in remission depression was brought to the ER after patient had a syncopal episode.  Patient states over the last 1 week he has been working around his yard and has been feeling very weak and tired.  Today while sitting with his family at home he felt very tired and worn out and lost consciousness while sitting with them.  The exact details of how long he lost consciousness or not clear.  EMS was called and had another syncopal episode with EMS.  At the time patient blood pressure was found to be in 80 systolic blood sugar was normal heart rate was around 80/min.  No definite seizure-like activities was noted.  Patient did not have any chest pain shortness of breath nausea vomiting diarrhea fever or chills or any cough.  Patient states he does not take any medications now.  He did drink some alcohol today and admits to taking marijuana.  He states he does not drink alcohol every day the last drink was 2 weeks ago.  Patient states when he had the first episode of syncope he did hit his left side of his face.  Complains of neck pain.  ED Course: In the ER patient appeared nonfocal appeared tired weak.  EKG showed normal sinus rhythm with nonspecific ST-T changes and acceptable limits of QTC.  Patient's creatinine has increased from 0.92 years ago to 2.1 bicarb was 21 CK level was 768 urine drug screen is positive for cocaine and marijuana.  CT head and C-spine unremarkable.  Patient was given 2 L fluid bolus in the ER and admitted for syncope with mild rhabdomyolysis.  Review of Systems: As per HPI, rest all negative.   Past Medical History:   Diagnosis Date  . Anxiety   . Asthma   . COPD (chronic obstructive pulmonary disease) ([place])   . Depression   . Hepatitis    Hep C  Pt states he has taken a 12 week medication for disease  . Right rotator cuff tear 03/05/2018  . Seizure ([place]) 11/2017    Past Surgical History:  Procedure Laterality Date  . SHOULDER ARTHROSCOPY WITH ROTATOR CUFF REPAIR Right 03/05/2018   Procedure: RIGHT SHOULDER ARTHROSCOPY WITH DEBRIDEMENT, [place], ROTATOR CUFF REPAIR;  Surgeon: Marchia Bond, MD;  Location: [facility];  Service: Orthopedics;  Laterality: Right;     reports that he has been smoking cigarettes. He has been smoking about 0.25 packs per day. He has never used smokeless tobacco. He reports previous alcohol use. He reports current drug use. Drug: Marijuana.  No Known Allergies  Family History  Problem Relation Age of Onset  . Alcoholism Father     Prior to Admission medications   Medication Sig Start Date End Date Taking? Authorizing Provider  albuterol (PROVENTIL HFA;VENTOLIN HFA) 108 (90 Base) MCG/ACT inhaler Inhale 1-2 puffs into the lungs every 6 (six) hours as needed for wheezing or shortness of breath. 01/19/16  Yes Charlynne Cousins, MD  ARIPiprazole (ABILIFY) 5 MG tablet Take 1 tablet (5 mg total) by mouth at bedtime. 03/31/15  Yes Kerrie Buffalo, NP  baclofen (LIORESAL) 10 MG tablet Take 1 tablet (10 mg total) by  mouth 3 (three) times daily. As needed for muscle spasm 03/05/18  Yes Marchia Bond, MD  budesonide-formoterol [facility]) 80-4.5 MCG/ACT inhaler Inhale 2 puffs into the lungs 2 (two) times daily.   Yes [provider]  traZODone (DESYREL) 150 MG tablet Take 1 tablet (150 mg total) by mouth at bedtime. 03/31/15  Yes Kerrie Buffalo, NP  chlordiazePOXIDE (LIBRIUM) 25 MG capsule 50mg  PO TID x 1D, then 25-50mg  PO BID X 1D, then 25-50mg  PO QD X 1D Patient not taking: Reported on 01/01/2019 11/29/17   Dorie Rank, MD  ondansetron (ZOFRAN) 4 MG  tablet Take 1 tablet (4 mg total) by mouth every 8 (eight) hours as needed for nausea or vomiting. Patient not taking: Reported on 01/01/2019 03/05/18   Marchia Bond, MD  oxyCODONE (ROXICODONE) 5 MG immediate release tablet Take 1 tablet (5 mg total) by mouth every 4 (four) hours as needed for severe pain. Patient not taking: Reported on 01/01/2019 03/05/18   Marchia Bond, MD  sennosides-docusate sodium (SENOKOT-S) 8.6-50 MG tablet Take 2 tablets by mouth daily. Patient not taking: Reported on 01/01/2019 03/05/18   Marchia Bond, MD    Physical Exam: Constitutional: Moderately built and nourished. Vitals:   01/01/19 0119 01/01/19 0245 01/01/19 0421 01/01/19 0438  BP: 100/67 107/72 130/89 130/89  Pulse: 69 60 60 60  Resp: 19 18 18 18   Temp: 97.9 F (36.6 C)  98.5 F (36.9 C)   TempSrc: Oral     SpO2: 97% 98% 100%   Height:    6' (1.829 m)   Eyes: Anicteric no pallor. ENMT: No discharge from the ears eyes nose or mouth. Neck: No mass felt.  No neck rigidity. Respiratory: No rhonchi or crepitations. Cardiovascular: S1-S2 heard. Abdomen: Soft nontender bowel sounds present. Musculoskeletal: No edema.  No joint effusion. Skin: No rash. Neurologic: Alert awake oriented to time place and person.  Moves all extremities. Psychiatric: Denies any suicidal ideation.   Labs on Admission: I have personally reviewed following labs and imaging studies  CBC: Recent Labs  Lab 01/01/19 0059  WBC 6.0  NEUTROABS 3.6  HGB 13.4  HCT 39.6  MCV 92.3  PLT 400   Basic Metabolic Panel: Recent Labs  Lab 01/01/19 0059  NA 140  K 3.9  CL 107  CO2 21*  GLUCOSE 136*  BUN 14  CREATININE 2.14*  CALCIUM 9.1   GFR: CrCl cannot be calculated (Unknown ideal weight.). Liver Function Tests: No results for input(s): AST, ALT, ALKPHOS, BILITOT, PROT, ALBUMIN in the last 168 hours. No results for input(s): LIPASE, AMYLASE in the last 168 hours. No results for input(s): AMMONIA in the last 168  hours. Coagulation Profile: No results for input(s): INR, PROTIME in the last 168 hours. Cardiac Enzymes: Recent Labs  Lab 01/01/19 0059  CKTOTAL 768*   BNP (last 3 results) No results for input(s): PROBNP in the last 8760 hours. HbA1C: No results for input(s): HGBA1C in the last 72 hours. CBG: No results for input(s): GLUCAP in the last 168 hours. Lipid Profile: No results for input(s): CHOL, HDL, LDLCALC, TRIG, CHOLHDL, LDLDIRECT in the last 72 hours. Thyroid Function Tests: No results for input(s): TSH, T4TOTAL, FREET4, T3FREE, THYROIDAB in the last 72 hours. Anemia Panel: No results for input(s): VITAMINB12, FOLATE, FERRITIN, TIBC, IRON, RETICCTPCT in the last 72 hours. Urine analysis: No results found for: COLORURINE, APPEARANCEUR, LABSPEC, PHURINE, GLUCOSEU, HGBUR, BILIRUBINUR, KETONESUR, PROTEINUR, UROBILINOGEN, NITRITE, LEUKOCYTESUR Sepsis Labs: @LABRCNTIP (procalcitonin:4,lacticidven:4) ) Recent Results (from the past 240 hour(s))  SARS  Coronavirus 2 (CEPHEID - Performed in [facility] hospital lab), Hosp Order     Status: None   Collection Time: 01/01/19  2:53 AM   Specimen: Nasopharyngeal Swab  Result Value Ref Range Status   SARS Coronavirus 2 NEGATIVE NEGATIVE Final    Comment: (NOTE) If result is NEGATIVE SARS-CoV-2 target nucleic acids are NOT DETECTED. The SARS-CoV-2 RNA is generally detectable in upper and lower  respiratory specimens during the acute phase of infection. The lowest  concentration of SARS-CoV-2 viral copies this assay can detect is 250  copies / mL. A negative result does not preclude SARS-CoV-2 infection  and should not be used as the sole basis for treatment or other  patient management decisions.  A negative result may occur with  improper specimen collection / handling, submission of specimen other  than nasopharyngeal swab, presence of viral mutation(s) within the  areas targeted by this assay, and inadequate number of viral copies   (<250 copies / mL). A negative result must be combined with clinical  observations, patient history, and epidemiological information. If result is POSITIVE SARS-CoV-2 target nucleic acids are DETECTED. The SARS-CoV-2 RNA is generally detectable in upper and lower  respiratory specimens dur ing the acute phase of infection.  Positive  results are indicative of active infection with SARS-CoV-2.  Clinical  correlation with patient history and other diagnostic information is  necessary to determine patient infection status.  Positive results do  not rule out bacterial infection or co-infection with other viruses. If result is PRESUMPTIVE POSTIVE SARS-CoV-2 nucleic acids MAY BE PRESENT.   A presumptive positive result was obtained on the submitted specimen  and confirmed on repeat testing.  While 2019 novel coronavirus  (SARS-CoV-2) nucleic acids may be present in the submitted sample  additional confirmatory testing may be necessary for epidemiological  and / or clinical management purposes  to differentiate between  SARS-CoV-2 and other Sarbecovirus currently known to infect humans.  If clinically indicated additional testing with an alternate test  methodology [phone]) is advised. The SARS-CoV-2 RNA is generally  detectable in upper and lower respiratory sp ecimens during the acute  phase of infection. The expected result is Negative. Fact Sheet for Patients:  StrictlyIdeas.no Fact Sheet for Healthcare Providers: BankingDealers.co.za This test is not yet approved or cleared by the [place] FDA and has been authorized for detection and/or diagnosis of SARS-CoV-2 by FDA under an Emergency Use Authorization (EUA).  This EUA will remain in effect (meaning this test can be used) for the duration of the COVID-19 declaration under Section 564(b)(1) of the Act, 21 U.S.C. section 360bbb-3(b)(1), unless the authorization is terminated or  revoked sooner. Performed at [facility], [place] [address] [place], [place] [address]      Radiological Exams on Admission: Ct Head Wo Contrast  Result Date: 01/01/2019 CLINICAL DATA:  Altered LOC syncopal episode EXAM: CT HEAD WITHOUT CONTRAST CT CERVICAL SPINE WITHOUT CONTRAST TECHNIQUE: Multidetector CT imaging of the head and cervical spine was performed following the standard protocol without intravenous contrast. Multiplanar CT image reconstructions of the cervical spine were also generated. COMPARISON:  CT 08/29/2014 FINDINGS: CT HEAD FINDINGS Brain: No evidence of acute infarction, hemorrhage, hydrocephalus, extra-axial collection or mass lesion/mass effect. Vascular: No hyperdense vessel or unexpected calcification. Skull: Normal. Negative for fracture or focal lesion. Sinuses/Orbits: Mild mucosal thickening in the ethmoid sinuses Other: None CT CERVICAL SPINE FINDINGS Alignment: Straightening of the cervical spine. No subluxation. Facet alignment within normal limits Skull base  and vertebrae: No acute fracture. No primary bone lesion or focal pathologic process. Ossified density adjacent to tip of dens, no change. Soft tissues and spinal canal: No prevertebral fluid or swelling. No visible canal hematoma. Disc levels: Mild to moderate diffuse degenerative change C4-C5, C5-C6 and C6-C7. Upper chest: Apical emphysema Other: None IMPRESSION: 1. Negative non contrasted CT appearance of the brain 2. Straightening of the cervical spine with degenerative changes. No acute osseous abnormality. Electronically Signed   By: Donavan Foil M.D.   On: 01/01/2019 03:56   Ct Cervical Spine Wo Contrast  Result Date: 01/01/2019 CLINICAL DATA:  Altered LOC syncopal episode EXAM: CT HEAD WITHOUT CONTRAST CT CERVICAL SPINE WITHOUT CONTRAST TECHNIQUE: Multidetector CT imaging of the head and cervical spine was performed following the standard protocol without intravenous contrast. Multiplanar CT image  reconstructions of the cervical spine were also generated. COMPARISON:  CT 08/29/2014 FINDINGS: CT HEAD FINDINGS Brain: No evidence of acute infarction, hemorrhage, hydrocephalus, extra-axial collection or mass lesion/mass effect. Vascular: No hyperdense vessel or unexpected calcification. Skull: Normal. Negative for fracture or focal lesion. Sinuses/Orbits: Mild mucosal thickening in the ethmoid sinuses Other: None CT CERVICAL SPINE FINDINGS Alignment: Straightening of the cervical spine. No subluxation. Facet alignment within normal limits Skull base and vertebrae: No acute fracture. No primary bone lesion or focal pathologic process. Ossified density adjacent to tip of dens, no change. Soft tissues and spinal canal: No prevertebral fluid or swelling. No visible canal hematoma. Disc levels: Mild to moderate diffuse degenerative change C4-C5, C5-C6 and C6-C7. Upper chest: Apical emphysema Other: None IMPRESSION: 1. Negative non contrasted CT appearance of the brain 2. Straightening of the cervical spine with degenerative changes. No acute osseous abnormality. Electronically Signed   By: Donavan Foil M.D.   On: 01/01/2019 03:56    EKG: Independently reviewed.  Normal sinus rhythm nonspecific T changes.  Assessment/Plan Principal Problem:   Syncope Active Problems:   Chronic hepatitis C without hepatic coma (HCC)   Hepatic cirrhosis (HCC)   Non-traumatic rhabdomyolysis   AKI (acute kidney injury) ([place])   Polysubstance abuse ([place])    1. Syncope -patient was found to be hypotensive at the site.  Could be from dehydration in the setting of cocaine abuse probably causing the syncope.  Monitor in telemetry check 2D echo continue hydration.  Since patient had previous history of seizures many years ago we will check EEG also. 2. Acute renal failure likely from dehydration check UA.  Follow intake output metabolic panel.  I think creatinine will improve with hydration. 3. Mild rhabdomyolysis could be from  dehydration and falling cocaine abuse.  Continue hydration follow CK levels. 4. Polysubstance abuse including cocaine and previous records states alcohol abuse.  But patient states he only drinks alcohol once in 2 weeks.  Social worker consult thiamine closely monitor for any withdrawal. 5. COPD not actively wheezing. 6. History of schizoaffective disorder not taking any medication at this time. 7. History of hepatitis C. 8. History of cirrhosis of liver per the chart.  May need to get more detailed history if able to contact family.   DVT prophylaxis: Lovenox. Code Status: Full code. Family Communication: Unable to reach family. Disposition Plan: Home. Consults called: None. Admission status: Observation.   Rise Patience MD [facility] Pager [phone].  If 7PM-7AM, please contact night-coverage [email or web address] Password [facility]  01/01/2019, 4:54 AM

## 2019-01-01 NOTE — ED Notes (Signed)
ED TO INPATIENT HANDOFF REPORT  ED Nurse Name and Phone #: Tray [place], [phone]  S Name/Age/Gender Victor Rowe 58 y.o. male Room/Bed: 020C/020C  Code Status   Code Status: Prior  Home/SNF/Other Home Patient oriented to: self, place, time and situation Is this baseline? Yes   Triage Complete: Triage complete  Chief Complaint syncope etoh  Triage Note Pt arrived from hm with c/o syncopal episode x 2 times; witnessed, one with family and one with EMS; pt found to be hypotensive, lethargic, and diaphoretic ETOH on board, and pt been working outside in the heat; EMS reports GCS of 11 initially until IV was started; pt A/O x 4 currently; BP 88 palpable; 80 HR; 98% on 2L for comfort; CBG 121; pt stated that he fell in kitchen and he believes he hit his jaw; SN noted blood on left nostril    Allergies No Known Allergies  Level of Care/Admitting Diagnosis ED Disposition    ED Disposition Condition [place]: [place] [100100]  Level of Care: Telemetry Medical [104]  I expect the patient will be discharged within 24 hours: No (not a candidate for 5C-Observation unit)  Covid Evaluation: Asymptomatic Screening Protocol (No Symptoms)  Diagnosis: Syncope [235573]  Admitting Physician: Rise Patience [phone]  Attending Physician: Rise Patience Lei.Right  PT Class (Do Not Modify): Observation [104]  PT Acc Code (Do Not Modify): Observation [10022]       B Medical/Surgery History Past Medical History:  Diagnosis Date  . Anxiety   . Asthma   . COPD (chronic obstructive pulmonary disease) ([place])   . Depression   . Hepatitis    Hep C  Pt states he has taken a 12 week medication for disease  . Right rotator cuff tear 03/05/2018  . Seizure ([place]) 11/2017   Past Surgical History:  Procedure Laterality Date  . SHOULDER ARTHROSCOPY WITH ROTATOR CUFF REPAIR Right 03/05/2018   Procedure: RIGHT SHOULDER ARTHROSCOPY WITH DEBRIDEMENT,  [place], ROTATOR CUFF REPAIR;  Surgeon: Marchia Bond, MD;  Location: [facility];  Service: Orthopedics;  Laterality: Right;     A IV Location/Drains/Wounds Patient Lines/Drains/Airways Status   Active Line/Drains/Airways    Name:   Placement date:   Placement time:   Site:   Days:   Peripheral IV 01/01/19 Left Forearm   01/01/19    0040    Forearm   less than 1   Incision (Closed) 03/05/18 Shoulder Right   03/05/18    1517     302          Intake/Output Last 24 hours  Intake/Output Summary (Last 24 hours) at 01/01/2019 0351 Last data filed at 01/01/2019 0140 Gross per 24 hour  Intake 1000 ml  Output -  Net 1000 ml    Labs/Imaging Results for orders placed or performed during the hospital encounter of 01/01/19 (from the past 48 hour(s))  CBC with Differential     Status: None   Collection Time: 01/01/19 12:59 AM  Result Value Ref Range   WBC 6.0 4.0 - 10.5 K/uL   RBC 4.29 4.22 - 5.81 MIL/uL   Hemoglobin 13.4 13.0 - 17.0 g/dL   HCT 39.6 39.0 - 52.0 %   MCV 92.3 80.0 - 100.0 fL   MCH 31.2 26.0 - 34.0 pg   MCHC 33.8 30.0 - 36.0 g/dL   RDW 13.8 11.5 - 15.5 %   Platelets 177 150 - 400 K/uL   nRBC 0.0  0.0 - 0.2 %   Neutrophils Relative % 60 %   Neutro Abs 3.6 1.7 - 7.7 K/uL   Lymphocytes Relative 32 %   Lymphs Abs 1.9 0.7 - 4.0 K/uL   Monocytes Relative 6 %   Monocytes Absolute 0.3 0.1 - 1.0 K/uL   Eosinophils Relative 1 %   Eosinophils Absolute 0.1 0.0 - 0.5 K/uL   Basophils Relative 1 %   Basophils Absolute 0.1 0.0 - 0.1 K/uL   Immature Granulocytes 0 %   Abs Immature Granulocytes 0.01 0.00 - 0.07 K/uL    Comment: Performed at [facility] [address], [place], [place] [address]  Basic metabolic panel     Status: Abnormal   Collection Time: 01/01/19 12:59 AM  Result Value Ref Range   Sodium 140 135 - 145 mmol/L   Potassium 3.9 3.5 - 5.1 mmol/L   Chloride 107 98 - 111 mmol/L   CO2 21 (L) 22 - 32 mmol/L   Glucose, Bld 136 (H) 70 - 99  mg/dL   BUN 14 6 - 20 mg/dL   Creatinine, Ser 2.14 (H) 0.61 - 1.24 mg/dL   Calcium 9.1 8.9 - 10.3 mg/dL   GFR calc non Af Amer 33 (L) >60 mL/min   GFR calc Af Amer 38 (L) >60 mL/min   Anion gap 12 5 - 15    Comment: Performed at [facility], [place] [address], [place], [place] [address]  CK     Status: Abnormal   Collection Time: 01/01/19 12:59 AM  Result Value Ref Range   Total CK 768 (H) 49 - 397 U/L    Comment: Performed at [facility], [place] [address], [place], [place] [address]  Ethanol     Status: Abnormal   Collection Time: 01/01/19 12:59 AM  Result Value Ref Range   Alcohol, Ethyl (B) 13 (H) <10 mg/dL    Comment: (NOTE) Lowest detectable limit for serum alcohol is 10 mg/dL. For medical purposes only. Performed at [facility], [place] [address] [place], [place] [address]    No results found.  Pending Labs Unresulted Labs (From admission, onward)    Start     Ordered   01/01/19 0236  Rapid urine drug screen (hospital performed)  ONCE - STAT,   STAT     01/01/19 0235   01/01/19 0235  SARS Coronavirus 2 (CEPHEID - Performed in [facility] hospital lab), Hosp Order  (Asymptomatic Patients Labs)  Once,   STAT    Question:  Rule Out  Answer:  Yes   01/01/19 0235          Vitals/Pain Today's Vitals   01/01/19 0025 01/01/19 0026 01/01/19 0119 01/01/19 0245  BP:  98/65 100/67 107/72  Pulse:   69 60  Resp:  (!) 21 19 18   Temp: (!) 97.5 F (36.4 C)  97.9 F (36.6 C)   TempSrc: Oral  Oral   SpO2:   97% 98%  PainSc: 2        Isolation Precautions No active isolations  Medications Medications  sodium chloride 0.9 % bolus 1,000 mL (0 mLs Intravenous Stopped 01/01/19 0140)  sodium chloride 0.9 % bolus 1,000 mL (1,000 mLs Intravenous New Bag/Given 01/01/19 0248)    Mobility walks Moderate fall risk   Focused Assessments Neuro Assessment Handoff:  Swallow screen pass? not yet assessed   NIH Stroke Scale ( + Modified Stroke Scale Criteria)   Interval: Initial Level of Consciousness (  1a.)   : Alert, keenly responsive LOC Questions (1b. )   +: Answers both questions correctly LOC Commands (1c. )   + : Performs both tasks correctly Best Gaze (2. )  +: Normal Visual (3. )  +: No visual loss Facial Palsy (4. )    : Normal symmetrical movements Motor Arm, Left (5a. )   +: No drift Motor Arm, Right (5b. )   +: No drift Motor Leg, Left (6a. )   +: No drift Motor Leg, Right (6b. )   +: No drift Limb Ataxia (7. ): Absent Sensory (8. )   +: Normal, no sensory loss Best Language (9. )   +: No aphasia Dysarthria (10. ): Normal Extinction/Inattention (11.)   +: No Abnormality Modified SS Total  +: 0 Complete NIHSS TOTAL: 0     Neuro Assessment: Exceptions to WDL Neuro Checks:   Initial (01/01/19 0032)  Last Documented NIHSS Modified Score: 0 (01/01/19 0032) Has TPA been given? No If patient is a Neuro Trauma and patient is going to OR before floor call report to [place] nurse: [phone] or [phone]     R Recommendations: See Admitting Provider Note  Report given to:   Additional Notes:

## 2019-01-01 NOTE — Progress Notes (Signed)
New Admission Note: ? Arrival Method: Stretcher Mental Orientation: Alert and Oriented X4 Telemetry: Yes, on Box [id number] Assessment: Completed Skin: Refer to flowsheet IV: Left forearm Pain: 3 Tubes: None Safety Measures: Safety Fall Prevention Plan discussed with patient. Admission: Completed 5 [place] Orientation: Patient has been orientated to the room, unit and the staff. Family: None at Bedside Orders have been reviewed and are being implemented. Will continue to monitor the patient. Call light has been placed within reach and bed alarm has been activated.  ? Milagros Loll), RN  Phone Number: [phone]

## 2019-01-01 NOTE — Procedures (Signed)
ELECTROENCEPHALOGRAM REPORT   Patient: Victor Rowe       Room #: 5M11C EEG No. ID: [id number] Age: 58 y.o.        Sex: male Referring Physician: Ghimire Report Date:  01/01/2019        Interpreting Physician: Alexis Goodell  History: Tresean Mattix is an 58 y.o. male with syncope  Medications:  Thiamine  Conditions of Recording:  This is a 21 channel routine scalp EEG performed with bipolar and monopolar montages arranged in accordance to the international 10/20 system of electrode placement. One channel was dedicated to EKG recording.  The patient is in the awake, drowsy and asleep states.  Description:  The waking background activity consists of a low voltage, symmetrical, fairly well organized, 10 Hz alpha activity, seen from the parieto-occipital and posterior temporal regions.  Low voltage fast activity, poorly organized, is seen anteriorly and is at times superimposed on more posterior regions.  A mixture of theta and alpha rhythms are seen from the central and temporal regions. The patient drowses with slowing to irregular, low voltage theta and beta activity.   The patient goes in to a light sleep with symmetrical sleep spindles, vertex central sharp transients and irregular slow activity.   No epileptiform activity is noted.   Hyperventilation was not performed.  Intermittent photic stimulation was performed but failed to illicit any change in the tracing.    IMPRESSION: Normal electroencephalogram, awake, asleep and with activation procedures. There are no focal lateralizing or epileptiform features.   Alexis Goodell, MD Neurology [phone] 01/01/2019, 2:53 PM

## 2019-01-01 NOTE — Progress Notes (Signed)
58 year old gentleman admitted early morning hours with 2 episodes of vasovagal/orthostatic syncope in the context of working in the heat, alcohol and cocaine and marijuana.  Already stabilizing.  Kidney functions improving.  Plan: Continue IV fluids.  Recheck renal functions tomorrow morning.  Mobilize.  Check orthostatic blood pressures.  Anticipate discharge home tomorrow morning if remains a stable.

## 2019-01-01 NOTE — Progress Notes (Signed)
Pt is having Echo will try back again later to see if pt is available

## 2019-01-01 NOTE — ED Provider Notes (Signed)
[facility] EMERGENCY DEPARTMENT Provider Note   CSN: [id number] Arrival date & time: 01/01/19  0012    History   Chief Complaint Chief Complaint  Patient presents with  . Loss of Consciousness    HPI Victor Rowe is a 58 y.o. male.     HPI  This is a 58 year old male with a history of COPD, depression, seizure who presents with loss of consciousness.  Patient with 2 witnessed episode of loss of consciousness per EMS.  One witnessed episode by family and one episode by EMS.  Patient reports feeling dizzy and lightheaded prior to passing out.  He does remember the events but "felt foggy."  He reports that over the last several days he has done a lot of work outside in the heat.  Today he drinks several liquor drinks which is not normal for him.  He also smoked marijuana.  Patient denies hitting his head.  He is not on any blood thinners.  He denies any recent illnesses including fevers, cough.  Denies chest pain or shortness of breath.  Past Medical History:  Diagnosis Date  . Anxiety   . Asthma   . COPD (chronic obstructive pulmonary disease) ([place])   . Depression   . Hepatitis    Hep C  Pt states he has taken a 12 week medication for disease  . Right rotator cuff tear 03/05/2018  . Seizure ([place]) 11/2017    Patient Active Problem List   Diagnosis Date Noted  . Syncope 01/01/2019  . Right rotator cuff tear 03/05/2018  . COPD with acute exacerbation ([place]) 01/18/2016  . COPD exacerbation ([place]) 01/18/2016  . Acute bronchitis 01/18/2016  . Cannabis use disorder, mild, abuse 03/29/2015  . Schizoaffective disorder, bipolar type ([place]) 03/29/2015  . Alcohol use disorder, moderate, dependence ([place]) 03/29/2015  . Hepatic cirrhosis ([place]) 01/24/2015  . Chronic hepatitis C without hepatic coma ([place]) 11/02/2014    Past Surgical History:  Procedure Laterality Date  . SHOULDER ARTHROSCOPY WITH ROTATOR CUFF REPAIR Right 03/05/2018   Procedure: RIGHT SHOULDER  ARTHROSCOPY WITH DEBRIDEMENT, [place], ROTATOR CUFF REPAIR;  Surgeon: Marchia Bond, MD;  Location: [facility];  Service: Orthopedics;  Laterality: Right;        Home Medications    Prior to Admission medications   Medication Sig Start Date End Date Taking? Authorizing Provider  albuterol (PROVENTIL HFA;VENTOLIN HFA) 108 (90 Base) MCG/ACT inhaler Inhale 1-2 puffs into the lungs every 6 (six) hours as needed for wheezing or shortness of breath. 01/19/16  Yes Charlynne Cousins, MD  ARIPiprazole (ABILIFY) 5 MG tablet Take 1 tablet (5 mg total) by mouth at bedtime. 03/31/15  Yes Kerrie Buffalo, NP  baclofen (LIORESAL) 10 MG tablet Take 1 tablet (10 mg total) by mouth 3 (three) times daily. As needed for muscle spasm 03/05/18  Yes Marchia Bond, MD  budesonide-formoterol [facility]) 80-4.5 MCG/ACT inhaler Inhale 2 puffs into the lungs 2 (two) times daily.   Yes [provider]  traZODone (DESYREL) 150 MG tablet Take 1 tablet (150 mg total) by mouth at bedtime. 03/31/15  Yes Kerrie Buffalo, NP  chlordiazePOXIDE (LIBRIUM) 25 MG capsule 50mg  PO TID x 1D, then 25-50mg  PO BID X 1D, then 25-50mg  PO QD X 1D Patient not taking: Reported on 01/01/2019 11/29/17   Dorie Rank, MD  ondansetron (ZOFRAN) 4 MG tablet Take 1 tablet (4 mg total) by mouth every 8 (eight) hours as needed for nausea or vomiting. Patient not taking: Reported on 01/01/2019  03/05/18   Marchia Bond, MD  oxyCODONE (ROXICODONE) 5 MG immediate release tablet Take 1 tablet (5 mg total) by mouth every 4 (four) hours as needed for severe pain. Patient not taking: Reported on 01/01/2019 03/05/18   Marchia Bond, MD  sennosides-docusate sodium (SENOKOT-S) 8.6-50 MG tablet Take 2 tablets by mouth daily. Patient not taking: Reported on 01/01/2019 03/05/18   Marchia Bond, MD    Family History Family History  Problem Relation Age of Onset  . Alcoholism Father     Social History Social History   Tobacco Use  .  Smoking status: Current Some Day Smoker    Packs/day: 0.25    Types: Cigarettes  . Smokeless tobacco: Never Used  . Tobacco comment: pt states he quit 3 days ago  Substance Use Topics  . Alcohol use: Not Currently    Comment: pt states has not had a drink in one month  . Drug use: Yes    Types: Marijuana    Comment: currently uses everyday     Allergies   Patient has no known allergies.   Review of Systems Review of Systems  Constitutional: Negative for fever.  Respiratory: Negative for shortness of breath.   Cardiovascular: Negative for chest pain.  Gastrointestinal: Negative for abdominal pain, nausea and vomiting.  Genitourinary: Negative for dysuria.  Skin: Negative for wound.  Neurological: Positive for dizziness and syncope. Negative for weakness.  All other systems reviewed and are negative.    Physical Exam Updated Vital Signs BP 107/72   Pulse 60   Temp 97.9 F (36.6 C) (Oral)   Resp 18   SpO2 98%   Physical Exam Vitals signs and nursing note reviewed.  Constitutional:      Appearance: He is well-developed.     Comments: Appears intoxicated, ABCs intact  HENT:     Head: Normocephalic and atraumatic.     Mouth/Throat:     Mouth: Mucous membranes are dry.  Eyes:     Pupils: Pupils are equal, round, and reactive to light.     Comments: Pupils 3 mm reactive bilaterally  Neck:     Musculoskeletal: Neck supple.  Cardiovascular:     Rate and Rhythm: Normal rate and regular rhythm.     Heart sounds: Normal heart sounds. No murmur.  Pulmonary:     Effort: Pulmonary effort is normal. No respiratory distress.     Breath sounds: Normal breath sounds. No wheezing.  Abdominal:     General: Bowel sounds are normal.     Palpations: Abdomen is soft.     Tenderness: There is no abdominal tenderness. There is no rebound.  Musculoskeletal:     Right lower leg: No edema.     Left lower leg: No edema.  Lymphadenopathy:     Cervical: No cervical adenopathy.   Skin:    General: Skin is warm and dry.  Neurological:     Mental Status: He is alert and oriented to person, place, and time.     Comments: No dysmetria to finger-nose-finger  Psychiatric:        Mood and Affect: Mood normal.      ED Treatments / Results  Labs (all labs ordered are listed, but only abnormal results are displayed) Labs Reviewed  BASIC METABOLIC PANEL - Abnormal; Notable for the following components:      Result Value   CO2 21 (*)    Glucose, Bld 136 (*)    Creatinine, Ser 2.14 (*)    GFR calc  non Af Amer 33 (*)    GFR calc Af Amer 38 (*)    All other components within normal limits  CK - Abnormal; Notable for the following components:   Total CK 768 (*)    All other components within normal limits  ETHANOL - Abnormal; Notable for the following components:   Alcohol, Ethyl (B) 13 (*)    All other components within normal limits  SARS CORONAVIRUS 2 (HOSPITAL ORDER, [facility])  CBC WITH DIFFERENTIAL/PLATELET  RAPID URINE DRUG SCREEN, HOSP PERFORMED    EKG EKG Interpretation  Date/Time:  Friday January 01 2019 00:25:32 EDT Ventricular Rate:  83 PR Interval:    QRS Duration: 90 QT Interval:  405 QTC Calculation: 476 R Axis:   -16 Text Interpretation:  Sinus rhythm Probable left atrial enlargement Borderline left axis deviation Borderline ST elevation, lateral leads Borderline prolonged QT interval Technically poor tracing Confirmed by Thayer Jew [phone]) on 01/01/2019 12:52:29 AM   Radiology No results found.  Procedures Procedures (including critical care time)  Medications Ordered in ED Medications  sodium chloride 0.9 % bolus 1,000 mL (0 mLs Intravenous Stopped 01/01/19 0140)  sodium chloride 0.9 % bolus 1,000 mL (1,000 mLs Intravenous New Bag/Given 01/01/19 0248)     Initial Impression / Assessment and Plan / ED Course  I have reviewed the triage vital signs and the nursing notes.  Pertinent labs & imaging  results that were available during my care of the patient were reviewed by me and considered in my medical decision making (see chart for details).        Patient presents with syncopal episode.  He is overall nontoxic-appearing and vital signs are notable for blood pressure of 98/65.  His neurologic exam is reassuring.  He appears drowsy and somewhat intoxicated.  EKG shows no evidence of arrhythmia or ischemia.  Orthostatics are negative.  Lab work-up notable for acute kidney injury with elevated creatinine as well as a CK greater than 700.  Patient likely in mild rhabdomyolysis secondary to dehydration and alcohol drug use.  Patient was given fluids.  We will plan for admission.  Final Clinical Impressions(s) / ED Diagnoses   Final diagnoses:  Non-traumatic rhabdomyolysis  Dehydration  Syncope, unspecified syncope type  AKI (acute kidney injury) [facility])    ED Discharge Orders    None       Merryl Hacker, MD 01/01/19 [phone]

## 2019-01-01 NOTE — Progress Notes (Signed)
  Echocardiogram 2D Echocardiogram has been performed.  Christophere Hillhouse G Jiovanny Burdell 01/01/2019, 9:29 AM

## 2019-01-01 NOTE — Plan of Care (Signed)
  Problem: Education: Goal: Knowledge of General Education information will improve Description: Including pain rating scale, medication(s)/side effects and non-pharmacologic comfort measures Outcome: Progressing   Problem: Coping: Goal: Level of anxiety will decrease Outcome: Progressing   

## 2019-01-01 NOTE — ED Triage Notes (Signed)
Pt arrived from hm with c/o syncopal episode x 2 times; witnessed, one with family and one with EMS; pt found to be hypotensive, lethargic, and diaphoretic ETOH on board, and pt been working outside in the heat; EMS reports GCS of 11 initially until IV was started; pt A/O x 4 currently; BP 88 palpable; 80 HR; 98% on 2L for comfort; CBG 121; pt stated that he fell in kitchen and he believes he hit his jaw; SN noted blood on left nostril

## 2019-01-02 DIAGNOSIS — F1910 Other psychoactive substance abuse, uncomplicated: Secondary | ICD-10-CM | POA: Diagnosis not present

## 2019-01-02 DIAGNOSIS — N179 Acute kidney failure, unspecified: Secondary | ICD-10-CM

## 2019-01-02 DIAGNOSIS — M6282 Rhabdomyolysis: Secondary | ICD-10-CM | POA: Diagnosis not present

## 2019-01-02 LAB — BASIC METABOLIC PANEL
Anion gap: 4 — ABNORMAL LOW (ref 5–15)
BUN: 9 mg/dL (ref 6–20)
CO2: 25 mmol/L (ref 22–32)
Calcium: 8.5 mg/dL — ABNORMAL LOW (ref 8.9–10.3)
Chloride: 112 mmol/L — ABNORMAL HIGH (ref 98–111)
Creatinine, Ser: 1.24 mg/dL (ref 0.61–1.24)
GFR calc Af Amer: >60 mL/min (ref >60)
GFR calc non Af Amer: >60 mL/min (ref >60)
Glucose, Bld: 106 mg/dL — ABNORMAL HIGH (ref 70–99)
Potassium: 4.3 mmol/L (ref 3.5–5.1)
Sodium: 141 mmol/L (ref 135–145)

## 2019-01-02 NOTE — Progress Notes (Signed)
DISCHARGE NOTE SNF Victor Rowe to be discharged Home per MD order. Patient verbalized understanding.  Skin clean, dry and intact without evidence of skin break down, no evidence of skin tears noted. IV catheter discontinued intact. Site without signs and symptoms of complications. Dressing and pressure applied. Pt denies pain at the site currently. No complaints noted.  Patient free of lines, drains, and wounds.   Discharge packet assembled. An After Visit Summary (AVS) was printed and given to the patient at bedside. Patient escorted via wheelchair and discharged to designated family member via private vehivle.  All questions and concerns addressed.   Dolores Hoose, RN

## 2019-01-02 NOTE — Discharge Summary (Signed)
Physician Discharge Summary  Victor Rowe YEM:[id number] DOB: [birth year] DOA: 01/01/2019  PCP: Benito Mccreedy, MD  Admit date: 01/01/2019 Discharge date: 01/02/2019  Admitted From: home  Disposition:  Home   Recommendations for Outpatient Follow-up:  1. Follow up with PCP in 1-2 weeks  Home Health: Not applicable Equipment/Devices: Not applicable  Discharge Condition: Stable CODE STATUS: Full code Diet recommendation:  Regular diet, plenty of hydration Discharge summary: 58 year old gentleman with history of COPD, chronic hepatitis C and polysubstance abuse who presented to the emergency room with 2 episodes of vasovagal/orthostatic syncope in the context of working in the heat, alcohol consumption and marijuana use.  His UDS was positive for cocaine and marijuana.  He presented with acute renal failure, orthostatic hypotension and clinical dehydration.  Patient was admitted to the hospital.  He was treated with aggressive IV fluids with complete improvement of symptoms. Blood pressures improved and no more orthostatic, walking on the hallway. Presented with creatinine of four 2.14 improved to 1.24 and normalized.  Good urine output. 2D echocardiogram was done that was normal.  EEG was done that was normal.  Patient was discharged home with extensive education counseling, hydration.  Not to use any alcohol or drugs to exacerbate his dehydration.  He is to resume all his home medications.  Discharge Diagnoses:  Principal Problem:   Syncope Active Problems:   Chronic hepatitis C without hepatic coma (HCC)   Hepatic cirrhosis (HCC)   Non-traumatic rhabdomyolysis   AKI (acute kidney injury) ([place])   Polysubstance abuse [facility])    Discharge Instructions  Discharge Instructions    Call MD for:  persistant dizziness or light-headedness   Complete by: As directed    Diet - low sodium heart healthy   Complete by: As directed    Discharge instructions   Complete by: As directed     Drink plenty of water   Increase activity slowly   Complete by: As directed      Allergies as of 01/02/2019   No Known Allergies     Medication List    STOP taking these medications   chlordiazePOXIDE 25 MG capsule Commonly known as: LIBRIUM   ondansetron 4 MG tablet Commonly known as: Zofran   oxyCODONE 5 MG immediate release tablet Commonly known as: Roxicodone   sennosides-docusate sodium 8.6-50 MG tablet Commonly known as: SENOKOT-S     TAKE these medications   albuterol 108 (90 Base) MCG/ACT inhaler Commonly known as: VENTOLIN HFA Inhale 1-2 puffs into the lungs every 6 (six) hours as needed for wheezing or shortness of breath.   ARIPiprazole 5 MG tablet Commonly known as: ABILIFY Take 1 tablet (5 mg total) by mouth at bedtime.   baclofen 10 MG tablet Commonly known as: LIORESAL Take 1 tablet (10 mg total) by mouth 3 (three) times daily. As needed for muscle spasm   budesonide-formoterol 80-4.5 MCG/ACT inhaler Commonly known as: SYMBICORT Inhale 2 puffs into the lungs 2 (two) times daily.   traZODone 150 MG tablet Commonly known as: DESYREL Take 1 tablet (150 mg total) by mouth at bedtime.       No Known Allergies  Consultations:  None   Procedures/Studies: Ct Head Wo Contrast  Result Date: 01/01/2019 CLINICAL DATA:  Altered LOC syncopal episode EXAM: CT HEAD WITHOUT CONTRAST CT CERVICAL SPINE WITHOUT CONTRAST TECHNIQUE: Multidetector CT imaging of the head and cervical spine was performed following the standard protocol without intravenous contrast. Multiplanar CT image reconstructions of the cervical spine were also generated. COMPARISON:  CT 08/29/2014 FINDINGS: CT HEAD FINDINGS Brain: No evidence of acute infarction, hemorrhage, hydrocephalus, extra-axial collection or mass lesion/mass effect. Vascular: No hyperdense vessel or unexpected calcification. Skull: Normal. Negative for fracture or focal lesion. Sinuses/Orbits: Mild mucosal thickening in  the ethmoid sinuses Other: None CT CERVICAL SPINE FINDINGS Alignment: Straightening of the cervical spine. No subluxation. Facet alignment within normal limits Skull base and vertebrae: No acute fracture. No primary bone lesion or focal pathologic process. Ossified density adjacent to tip of dens, no change. Soft tissues and spinal canal: No prevertebral fluid or swelling. No visible canal hematoma. Disc levels: Mild to moderate diffuse degenerative change C4-C5, C5-C6 and C6-C7. Upper chest: Apical emphysema Other: None IMPRESSION: 1. Negative non contrasted CT appearance of the brain 2. Straightening of the cervical spine with degenerative changes. No acute osseous abnormality. Electronically Signed   By: Donavan Foil M.D.   On: 01/01/2019 03:56   Ct Cervical Spine Wo Contrast  Result Date: 01/01/2019 CLINICAL DATA:  Altered LOC syncopal episode EXAM: CT HEAD WITHOUT CONTRAST CT CERVICAL SPINE WITHOUT CONTRAST TECHNIQUE: Multidetector CT imaging of the head and cervical spine was performed following the standard protocol without intravenous contrast. Multiplanar CT image reconstructions of the cervical spine were also generated. COMPARISON:  CT 08/29/2014 FINDINGS: CT HEAD FINDINGS Brain: No evidence of acute infarction, hemorrhage, hydrocephalus, extra-axial collection or mass lesion/mass effect. Vascular: No hyperdense vessel or unexpected calcification. Skull: Normal. Negative for fracture or focal lesion. Sinuses/Orbits: Mild mucosal thickening in the ethmoid sinuses Other: None CT CERVICAL SPINE FINDINGS Alignment: Straightening of the cervical spine. No subluxation. Facet alignment within normal limits Skull base and vertebrae: No acute fracture. No primary bone lesion or focal pathologic process. Ossified density adjacent to tip of dens, no change. Soft tissues and spinal canal: No prevertebral fluid or swelling. No visible canal hematoma. Disc levels: Mild to moderate diffuse degenerative change  C4-C5, C5-C6 and C6-C7. Upper chest: Apical emphysema Other: None IMPRESSION: 1. Negative non contrasted CT appearance of the brain 2. Straightening of the cervical spine with degenerative changes. No acute osseous abnormality. Electronically Signed   By: Donavan Foil M.D.   On: 01/01/2019 03:56       Subjective: Patient was seen and examined.  He is walking in the hallway.  No complaints.  Eager to go home.  He thinks his marijuana was laced with cocaine that is why his UDS was positive for cocaine.   Discharge Exam: Vitals:   01/01/19 2053 01/02/19 0438  BP: 118/81 126/84  Pulse: (!) 58 (!) 58  Resp: 19 18  Temp: 97.8 F (36.6 C) 97.8 F (36.6 C)  SpO2: 100% 100%   Vitals:   01/01/19 0951 01/01/19 1709 01/01/19 2053 01/02/19 0438  BP: (!) 120/98 129/80 118/81 126/84  Pulse: (!) 57 (!) 52 (!) 58 (!) 58  Resp: 16 16 19 18   Temp: 97.8 F (36.6 C) 98.2 F (36.8 C) 97.8 F (36.6 C) 97.8 F (36.6 C)  TempSrc: Oral Oral Oral Oral  SpO2: 100% 100% 100% 100%  Height:        General: Pt is alert, awake, not in acute distress, on room air. Cardiovascular: RRR, S1/S2 +, no rubs, no gallops Respiratory: CTA bilaterally, no wheezing, no rhonchi Abdominal: Soft, NT, ND, bowel sounds + Extremities: no edema, no cyanosis    The results of significant diagnostics from this hospitalization (including imaging, microbiology, ancillary and laboratory) are listed below for reference.     Microbiology: Recent Results (from  the past 240 hour(s))  SARS Coronavirus 2 (CEPHEID - Performed in [place] hospital lab), Hosp Order     Status: None   Collection Time: 01/01/19  2:53 AM   Specimen: Nasopharyngeal Swab  Result Value Ref Range Status   SARS Coronavirus 2 NEGATIVE NEGATIVE Final    Comment: (NOTE) If result is NEGATIVE SARS-CoV-2 target nucleic acids are NOT DETECTED. The SARS-CoV-2 RNA is generally detectable in upper and lower  respiratory specimens during the acute phase  of infection. The lowest  concentration of SARS-CoV-2 viral copies this assay can detect is 250  copies / mL. A negative result does not preclude SARS-CoV-2 infection  and should not be used as the sole basis for treatment or other  patient management decisions.  A negative result may occur with  improper specimen collection / handling, submission of specimen other  than nasopharyngeal swab, presence of viral mutation(s) within the  areas targeted by this assay, and inadequate number of viral copies  (<250 copies / mL). A negative result must be combined with clinical  observations, patient history, and epidemiological information. If result is POSITIVE SARS-CoV-2 target nucleic acids are DETECTED. The SARS-CoV-2 RNA is generally detectable in upper and lower  respiratory specimens dur ing the acute phase of infection.  Positive  results are indicative of active infection with SARS-CoV-2.  Clinical  correlation with patient history and other diagnostic information is  necessary to determine patient infection status.  Positive results do  not rule out bacterial infection or co-infection with other viruses. If result is PRESUMPTIVE POSTIVE SARS-CoV-2 nucleic acids MAY BE PRESENT.   A presumptive positive result was obtained on the submitted specimen  and confirmed on repeat testing.  While 2019 novel coronavirus  (SARS-CoV-2) nucleic acids may be present in the submitted sample  additional confirmatory testing may be necessary for epidemiological  and / or clinical management purposes  to differentiate between  SARS-CoV-2 and other Sarbecovirus currently known to infect humans.  If clinically indicated additional testing with an alternate test  methodology [phone]) is advised. The SARS-CoV-2 RNA is generally  detectable in upper and lower respiratory sp ecimens during the acute  phase of infection. The expected result is Negative. Fact Sheet for Patients:   StrictlyIdeas.no Fact Sheet for Healthcare Providers: BankingDealers.co.za This test is not yet approved or cleared by the [place] FDA and has been authorized for detection and/or diagnosis of SARS-CoV-2 by FDA under an Emergency Use Authorization (EUA).  This EUA will remain in effect (meaning this test can be used) for the duration of the COVID-19 declaration under Section 564(b)(1) of the Act, 21 U.S.C. section 360bbb-3(b)(1), unless the authorization is terminated or revoked sooner. Performed at [facility], [place] [address], [place], [place] [address]      Labs: BNP (last 3 results) No results for input(s): BNP in the last 8760 hours. Basic Metabolic Panel: Recent Labs  Lab 01/01/19 0059 01/01/19 0515 01/02/19 0555  NA 140 141 141  K 3.9 4.4 4.3  CL 107 111 112*  CO2 21* 22 25  GLUCOSE 136* 107* 106*  BUN 14 15 9   CREATININE 2.14* 1.43* 1.24  CALCIUM 9.1 8.9 8.5*  MG  --  2.1  --    Liver Function Tests: Recent Labs  Lab 01/01/19 0515  AST 29  ALT 24  ALKPHOS 50  BILITOT 0.7  PROT 6.2*  ALBUMIN 3.7   No results for input(s): LIPASE, AMYLASE in the last 168 hours.  No results for input(s): AMMONIA in the last 168 hours. CBC: Recent Labs  Lab 01/01/19 0059 01/01/19 0515  WBC 6.0 7.3  NEUTROABS 3.6 5.1  HGB 13.4 13.0  HCT 39.6 37.9*  MCV 92.3 91.5  PLT 177 166   Cardiac Enzymes: Recent Labs  Lab 01/01/19 0059 01/01/19 0515 01/01/19 1026  CKTOTAL 768* 699* 679*   BNP: Invalid input(s): POCBNP CBG: No results for input(s): GLUCAP in the last 168 hours. D-Dimer No results for input(s): DDIMER in the last 72 hours. Hgb A1c No results for input(s): HGBA1C in the last 72 hours. Lipid Profile No results for input(s): CHOL, HDL, LDLCALC, TRIG, CHOLHDL, LDLDIRECT in the last 72 hours. Thyroid function studies Recent Labs    01/01/19 0515  TSH 0.463   Anemia work up No results for  input(s): VITAMINB12, FOLATE, FERRITIN, TIBC, IRON, RETICCTPCT in the last 72 hours. Urinalysis No results found for: COLORURINE, APPEARANCEUR, [place], [place], GLUCOSEU, [place], [place], KETONESUR, PROTEINUR, UROBILINOGEN, NITRITE, LEUKOCYTESUR Sepsis Labs Invalid input(s): PROCALCITONIN,  WBC,  LACTICIDVEN Microbiology Recent Results (from the past 240 hour(s))  SARS Coronavirus 2 (CEPHEID - Performed in [place] hospital lab), Hosp Order     Status: None   Collection Time: 01/01/19  2:53 AM   Specimen: Nasopharyngeal Swab  Result Value Ref Range Status   SARS Coronavirus 2 NEGATIVE NEGATIVE Final    Comment: (NOTE) If result is NEGATIVE SARS-CoV-2 target nucleic acids are NOT DETECTED. The SARS-CoV-2 RNA is generally detectable in upper and lower  respiratory specimens during the acute phase of infection. The lowest  concentration of SARS-CoV-2 viral copies this assay can detect is 250  copies / mL. A negative result does not preclude SARS-CoV-2 infection  and should not be used as the sole basis for treatment or other  patient management decisions.  A negative result may occur with  improper specimen collection / handling, submission of specimen other  than nasopharyngeal swab, presence of viral mutation(s) within the  areas targeted by this assay, and inadequate number of viral copies  (<250 copies / mL). A negative result must be combined with clinical  observations, patient history, and epidemiological information. If result is POSITIVE SARS-CoV-2 target nucleic acids are DETECTED. The SARS-CoV-2 RNA is generally detectable in upper and lower  respiratory specimens dur ing the acute phase of infection.  Positive  results are indicative of active infection with SARS-CoV-2.  Clinical  correlation with patient history and other diagnostic information is  necessary to determine patient infection status.  Positive results do  not rule out bacterial infection or co-infection  with other viruses. If result is PRESUMPTIVE POSTIVE SARS-CoV-2 nucleic acids MAY BE PRESENT.   A presumptive positive result was obtained on the submitted specimen  and confirmed on repeat testing.  While 2019 novel coronavirus  (SARS-CoV-2) nucleic acids may be present in the submitted sample  additional confirmatory testing may be necessary for epidemiological  and / or clinical management purposes  to differentiate between  SARS-CoV-2 and other Sarbecovirus currently known to infect humans.  If clinically indicated additional testing with an alternate test  methodology [phone]) is advised. The SARS-CoV-2 RNA is generally  detectable in upper and lower respiratory sp ecimens during the acute  phase of infection. The expected result is Negative. Fact Sheet for Patients:  StrictlyIdeas.no Fact Sheet for Healthcare Providers: BankingDealers.co.za This test is not yet approved or cleared by the [place] FDA and has been authorized for detection and/or diagnosis of SARS-CoV-2 by  FDA under an Emergency Use Authorization (EUA).  This EUA will remain in effect (meaning this test can be used) for the duration of the COVID-19 declaration under Section 564(b)(1) of the Act, 21 U.S.C. section 360bbb-3(b)(1), unless the authorization is terminated or revoked sooner. Performed at [facility], [place] [address] [place], [place] [address]      Time coordinating discharge: 30 minutes  SIGNED:   Barb Merino, MD  [facility] 01/02/2019, 8:21 AM

## 2019-01-02 NOTE — Plan of Care (Signed)
  Problem: Clinical Measurements: Goal: Ability to maintain clinical measurements within normal limits will improve Outcome: Progressing   Problem: Activity: Goal: Risk for activity intolerance will decrease Outcome: Progressing   Problem: Clinical Measurements: Goal: Ability to maintain clinical measurements within normal limits will improve Outcome: Progressing

## 2019-01-02 NOTE — Plan of Care (Signed)
  Problem: Education: Goal: Knowledge of General Education information will improve Description: Including pain rating scale, medication(s)/side effects and non-pharmacologic comfort measures 01/02/2019 0931 by Dolores Hoose, RN Outcome: Adequate for Discharge 01/02/2019 0904 by Dolores Hoose, RN Outcome: Progressing   Problem: Clinical Measurements: Goal: Diagnostic test results will improve Outcome: Adequate for Discharge

## 2020-08-29 ENCOUNTER — Other Ambulatory Visit: Payer: Self-pay

## 2020-08-29 ENCOUNTER — Ambulatory Visit (HOSPITAL_COMMUNITY)
Admission: EM | Admit: 2020-08-29 | Discharge: 2020-08-29 | Disposition: A | Discharge status: Home / Self Care | Payer: Medicaid Other | Attending: Student | Admitting: Student

## 2020-08-29 ENCOUNTER — Encounter (HOSPITAL_COMMUNITY): Payer: Self-pay | Admitting: *Deleted

## 2020-08-29 DIAGNOSIS — R634 Abnormal weight loss: Secondary | ICD-10-CM

## 2020-08-29 DIAGNOSIS — R112 Nausea with vomiting, unspecified: Secondary | ICD-10-CM

## 2020-08-29 DIAGNOSIS — A084 Viral intestinal infection, unspecified: Secondary | ICD-10-CM

## 2020-08-29 DIAGNOSIS — F17200 Nicotine dependence, unspecified, uncomplicated: Secondary | ICD-10-CM

## 2020-08-29 DIAGNOSIS — J410 Simple chronic bronchitis: Secondary | ICD-10-CM

## 2020-08-29 MED ORDER — LOPERAMIDE HCL 2 MG PO CAPS: 2.0000 mg | ORAL_CAPSULE | Freq: Four times a day (QID) | ORAL | 0 refills | Status: AC | PRN

## 2020-08-29 MED ORDER — ONDANSETRON 8 MG PO TBDP: 8.0000 mg | ORAL_TABLET | Freq: Three times a day (TID) | ORAL | 0 refills | Status: AC | PRN

## 2020-08-29 NOTE — ED Triage Notes (Signed)
Pt reports he can not eat and has vomited one time. Pt reports he is loosing weight like crazy. All the Sx's started one week to 4 days ago per pt.

## 2020-08-29 NOTE — Discharge Instructions (Addendum)
-  For nausea, start the Zofran (ondansetron).  Dissolve 1 pill under your tongue up to 3 times daily. -I also sent a prescription for Imodium (loperamide).  You can take 1 pill up to every 6 hours as needed for diarrhea. -Please follow-up with your primary care about the unintentional weight loss.  They will likely refer you to a gastroenterologist for a colonoscopy. -If your symptoms get worse instead of better, like worsening of abdominal pain, inability to keep fluids down, vomiting of blood, blood in stool-head to the ER for additional medical treatment.

## 2020-08-29 NOTE — ED Provider Notes (Signed)
Victor Rowe    CSN: [id number] Arrival date & time: 08/29/20  1120      History   Chief Complaint Chief Complaint  Patient presents with  . Emesis  . Chills    HPI Victor Rowe is a 60 y.o. male presenting with decreased appetite, nausea, vomiting x3 days.  History anxiety, asthma, COPD, depression, hep C status post treatment, rotator cuff tear, seizures, rhabdo, AKI, polysubstance abuse, schizoaffective disorder, alcohol use disorder.  States that he has thrown up one time.  Cannot eat due to decreased appetite.  Occasional generalized abdominal pain but none now. Unintentional weight loss of 70 pounds in last 2 years. Has never had a colonoscopy.  Denies diarrhea, BRBPR, dark tarry stool, hematemesis.  Denies abdominal pain following eating.  Denies URI symptoms, fever/chills, cough.  Using inhalers as directed.  Current daily smoker.  HPI  Past Medical History:  Diagnosis Date  . Anxiety   . Asthma   . COPD (chronic obstructive pulmonary disease) ([place])   . Depression   . Hepatitis    Hep C  Pt states he has taken a 12 week medication for disease  . Right rotator cuff tear 03/05/2018  . Seizure ([place]) 11/2017    Patient Active Problem List   Diagnosis Date Noted  . Syncope 01/01/2019  . Non-traumatic rhabdomyolysis 01/01/2019  . AKI (acute kidney injury) ([place]) 01/01/2019  . Polysubstance abuse ([place]) 01/01/2019  . Right rotator cuff tear 03/05/2018  . COPD with acute exacerbation ([place]) 01/18/2016  . COPD exacerbation ([place]) 01/18/2016  . Acute bronchitis 01/18/2016  . Cannabis use disorder, mild, abuse 03/29/2015  . Schizoaffective disorder, bipolar type ([place]) 03/29/2015  . Alcohol use disorder, moderate, dependence ([place]) 03/29/2015  . Hepatic cirrhosis ([place]) 01/24/2015  . Chronic hepatitis C without hepatic coma ([place]) 11/02/2014    Past Surgical History:  Procedure Laterality Date  . SHOULDER ARTHROSCOPY WITH ROTATOR CUFF REPAIR Right 03/05/2018    Procedure: RIGHT SHOULDER ARTHROSCOPY WITH DEBRIDEMENT, [place], ROTATOR CUFF REPAIR;  Surgeon: Marchia Bond, MD;  Location: [facility];  Service: Orthopedics;  Laterality: Right;       Home Medications    Prior to Admission medications   Medication Sig Start Date End Date Taking? Authorizing Provider  loperamide (IMODIUM) 2 MG capsule Take 1 capsule (2 mg total) by mouth 4 (four) times daily as needed for diarrhea or loose stools. 08/29/20  Yes Hazel Sams, PA-C  ondansetron (ZOFRAN ODT) 8 MG disintegrating tablet Take 1 tablet (8 mg total) by mouth every 8 (eight) hours as needed for nausea or vomiting. 08/29/20  Yes Hazel Sams, PA-C  albuterol (PROVENTIL HFA;VENTOLIN HFA) 108 (90 Base) MCG/ACT inhaler Inhale 1-2 puffs into the lungs every 6 (six) hours as needed for wheezing or shortness of breath. 01/19/16   Charlynne Cousins, MD  ARIPiprazole (ABILIFY) 5 MG tablet Take 1 tablet (5 mg total) by mouth at bedtime. 03/31/15   Kerrie Buffalo, NP  baclofen (LIORESAL) 10 MG tablet Take 1 tablet (10 mg total) by mouth 3 (three) times daily. As needed for muscle spasm 03/05/18   Marchia Bond, MD  budesonide-formoterol [facility]) 80-4.5 MCG/ACT inhaler Inhale 2 puffs into the lungs 2 (two) times daily.    [provider]  traZODone (DESYREL) 150 MG tablet Take 1 tablet (150 mg total) by mouth at bedtime. 03/31/15   Kerrie Buffalo, NP    Family History Family History  Problem Relation Age of Onset  . Alcoholism Father  Social History Social History   Tobacco Use  . Smoking status: Current Some Day Smoker    Packs/day: 0.25    Types: Cigarettes  . Smokeless tobacco: Never Used  . Tobacco comment: pt states he quit 3 days ago  Vaping Use  . Vaping Use: Every day  Substance Use Topics  . Alcohol use: Not Currently    Comment: pt states has not had a drink in one month  . Drug use: Yes    Types: Marijuana    Comment: currently uses everyday      Allergies   Patient has no known allergies.   Review of Systems Review of Systems  Constitutional: Positive for appetite change and unexpected weight change. Negative for chills, diaphoresis and fever.  HENT: Negative for congestion, ear pain, sinus pressure, sinus pain, sneezing, sore throat and trouble swallowing.   Respiratory: Negative for cough, chest tightness and shortness of breath.   Cardiovascular: Negative for chest pain.  Gastrointestinal: Positive for nausea and vomiting. Negative for abdominal distention, abdominal pain, anal bleeding, blood in stool, constipation, diarrhea and rectal pain.  Genitourinary: Negative for dysuria, flank pain, frequency and urgency.  Musculoskeletal: Negative for back pain and myalgias.  Neurological: Negative for dizziness, light-headedness and headaches.  All other systems reviewed and are negative.    Physical Exam Triage Vital Signs ED Triage Vitals  Enc Vitals Group     BP 08/29/20 1215 128/90     Pulse Rate 08/29/20 1215 75     Resp 08/29/20 1215 20     Temp 08/29/20 1215 98.1 F (36.7 C)     Temp Source 08/29/20 1215 Oral     SpO2 08/29/20 1215 100 %     Weight --      Height --      Head Circumference --      Peak Flow --      Pain Score 08/29/20 1213 6     Pain Loc --      Pain Edu? --      Excl. in [place]? --    No data found.  Updated Vital Signs BP 128/90 (BP Location: Right Arm)   Pulse 75   Temp 98.1 F (36.7 C) (Oral)   Resp 20   SpO2 100%   Visual Acuity Right Eye Distance:   Left Eye Distance:   Bilateral Distance:    Right Eye Near:   Left Eye Near:    Bilateral Near:     Physical Exam Vitals reviewed.  Constitutional:      General: He is not in acute distress.    Appearance: Normal appearance. He is not ill-appearing.  HENT:     Head: Normocephalic and atraumatic.     Mouth/Throat:     Mouth: Mucous membranes are moist.     Comments: Moist mucous membranes Eyes:     Extraocular  Movements: Extraocular movements intact.     Pupils: Pupils are equal, round, and reactive to light.  Cardiovascular:     Rate and Rhythm: Normal rate and regular rhythm.     Heart sounds: Normal heart sounds.  Pulmonary:     Effort: Pulmonary effort is normal. Prolonged expiration present.     Breath sounds: Rhonchi present. No decreased breath sounds, wheezing or rales.     Comments: Faint rhonchi throughout Abdominal:     General: Abdomen is flat. Bowel sounds are normal. There is no distension.     Palpations: Abdomen is soft. There is no mass.  Tenderness: There is no abdominal tenderness. There is no right CVA tenderness, left CVA tenderness, guarding or rebound. Negative signs include Murphy's sign, Rovsing's sign and McBurney's sign.     Comments: No abdominal pain   Skin:    General: Skin is warm.     Capillary Refill: Capillary refill takes less than 2 seconds.     Comments: Good skin turgor  Neurological:     General: No focal deficit present.     Mental Status: He is alert and oriented to person, place, and time.  Psychiatric:        Mood and Affect: Mood normal.        Behavior: Behavior normal.      UC Treatments / Results  Labs (all labs ordered are listed, but only abnormal results are displayed) Labs Reviewed - No data to display  EKG   Radiology No results found.  Procedures Procedures (including critical care time)  Medications Ordered in UC Medications - No data to display  Initial Impression / Assessment and Plan / UC Course  I have reviewed the triage vital signs and the nursing notes.  Pertinent labs & imaging results that were available during my care of the patient were reviewed by me and considered in my medical decision making (see chart for details).     This patient is a 60 year old male presenting with viral gastroenteritis. Today he is  afebrile nontachycardic nontachypneic, oxygenating well on room air. Appears well  hydrated.  For viral gastroenteritis, zofran prn.   F/u with PCP for unintentional weight loss. He has never had a colonoscopy.  precontemplative of smoking cessation.  For COPD, continue inhalers.  Return precautions discussed.  This chart was dictated using voice recognition software, Dragon. Despite the best efforts of this provider to proofread and correct errors, errors may still occur which can change documentation meaning.   Final Clinical Impressions(s) / UC Diagnoses   Final diagnoses:  Viral gastroenteritis  Non-intractable vomiting with nausea, unspecified vomiting type  Unintentional weight loss  Simple chronic bronchitis (HCC)  Current smoker     Discharge Instructions     -For nausea, start the Zofran (ondansetron).  Dissolve 1 pill under your tongue up to 3 times daily. -I also sent a prescription for Imodium (loperamide).  You can take 1 pill up to every 6 hours as needed for diarrhea. -Please follow-up with your primary care about the unintentional weight loss.  They will likely refer you to a gastroenterologist for a colonoscopy. -If your symptoms get worse instead of better, like worsening of abdominal pain, inability to keep fluids down, vomiting of blood, blood in stool-head to the ER for additional medical treatment.    ED Prescriptions    Medication Sig Dispense Auth. Provider   ondansetron (ZOFRAN ODT) 8 MG disintegrating tablet Take 1 tablet (8 mg total) by mouth every 8 (eight) hours as needed for nausea or vomiting. 20 tablet Hazel Sams, PA-C   loperamide (IMODIUM) 2 MG capsule Take 1 capsule (2 mg total) by mouth 4 (four) times daily as needed for diarrhea or loose stools. 12 capsule Hazel Sams, PA-C     PDMP not reviewed this encounter.   Hazel Sams, PA-C 08/29/20 1258

## 2022-04-23 ENCOUNTER — Ambulatory Visit (HOSPITAL_COMMUNITY)
Admission: EM | Admit: 2022-04-23 | Discharge: 2022-04-23 | Disposition: A | Discharge status: Home / Self Care | Payer: Medicaid Other | Attending: Psychiatry | Admitting: Psychiatry

## 2022-04-23 DIAGNOSIS — F419 Anxiety disorder, unspecified: Secondary | ICD-10-CM | POA: Insufficient documentation

## 2022-04-23 DIAGNOSIS — F1910 Other psychoactive substance abuse, uncomplicated: Secondary | ICD-10-CM | POA: Insufficient documentation

## 2022-04-23 DIAGNOSIS — F1020 Alcohol dependence, uncomplicated: Secondary | ICD-10-CM | POA: Insufficient documentation

## 2022-04-23 DIAGNOSIS — F17210 Nicotine dependence, cigarettes, uncomplicated: Secondary | ICD-10-CM | POA: Insufficient documentation

## 2022-04-23 DIAGNOSIS — Z789 Other specified health status: Secondary | ICD-10-CM

## 2022-04-23 DIAGNOSIS — F250 Schizoaffective disorder, bipolar type: Secondary | ICD-10-CM | POA: Insufficient documentation

## 2022-04-23 NOTE — Discharge Instructions (Signed)
Please come to [facility] (this facility, SECOND FLOOR) during walk in hours for appointment with psychiatrist/provider for further medication management and for therapists for therapy.   Walk in hours for therapy/counseling: Monday through Thursday 8AM until slots are full. Every Friday 1PM-4PM.  Walk in hours for psychiatry/medication management: Monday through Friday 8AM-11AM.   When you arrive please go upstairs for your appointment. If you are unsure of where to go, inform the front desk that you are here for a walk in appointment and they will assist you with directions upstairs.  Address:  [address], in [place], [place] Ph: [phone]

## 2022-04-23 NOTE — Progress Notes (Signed)
Victor Rowe received his AVS, questions answered and he was discharged without incident.

## 2022-04-23 NOTE — ED Provider Notes (Signed)
Behavioral Health Urgent Care Medical Screening Exam  Patient Name: Victor Rowe MRN: [id number] Date of Evaluation: 04/23/22 Chief Complaint: "Dario Guardian" Diagnosis:  Final diagnoses:  Need for community resource   History of Present illness: Victor Rowe is a 61 y.o. male. Pt presents voluntarily to [facility] behavioral health for walk-in assessment.  Pt is accompanied by his wife, [name], who remains with pt throughout the assessment as per pt verbal consent/request. Pt is assessed face-to-face by nurse practitioner.   Victor Rowe, 61 y.o., male patient seen face to face by this provider, and chart reviewed on 04/23/22.  On evaluation Victor Rowe reports he is presenting today due to "Dario Guardian". Pt reports Victor Rowe is with [facility] and has referred him to his facility to establish outpatient medication management and counseling services.  [name] states pt was connected with counseling services at [facility], although has not been attending for about 5 months. Pt was receiving medication services at [facility]. [name] and pt state pt takes multiple psychiatric medications for anxiety, depression, sleep, schizophrenia, bipolar disorder. Per chart review, pt w/ hx of anxiety, depression, cannabis use disorder, mild, abuse, schizoaffective disorder, bipolar type, alcohol use disorder, moderate, dependence, and polysubstance abuse. [name] and pt state pt is medication adherent. They cannot recall the medications or dosages he is taking but believe he is taking zoloft, abilify, and trazodone.   Pt reports use of cigarettes daily. He states 1 pack will last him 3 or 4 days. Pt reports use of alcohol once every 2 weeks. Pt will have 1 beer if by himself. Pt will have 4 or 5 drinks in social settings. Pt reports use of marijuana once or twice every 2 or 3 weeks. He will have 2 to 3 blunts.   Pt endorses hx of 1 SA greater than 10 years ago, OD on medication. Pt endorses hx of 2 inpatient  psychiatric hospitalizations, most recently 2 or 10 years ago. Pt endorses hx of NSSIB cutting occurring greater than 10 years ago.   Pt denies knowledge of family psychiatric hx.   Pt is living with his wife.  Pt is not currently employed. Per [name], pt is "not employable" due to "attitude, agitation".  Pt's highest level of education is the 11th grade.   Pt denies access to a firearm or other weapon, which [name] confirms.   Pt reports sleeping 6 hours on a good night; 2 or 3 hours on a bad night. He reports there are more bad nights than good nights.  Pt reports good appetite.   Pt denies SI/VI/HI, AVH, paranoia. He does not appear to be responding to internal stimuli. There is no evidence of agitation, aggression or distractibility. Pt is calm, cooperative, pleasant.   Discussed establishing outpatient services at [facility]. Discussed walk in hours. Pt can be seen as early as tomorrow to establish services. Pt verbally contracts to safety for himself and others. [name] denies safety concerns with pt discharge today. They agree to follow up outpatient. Discussed bringing pt's medications with him when they establish services so that provider is aware of current medication regimen.  Psychiatric Specialty Exam  Presentation  General Appearance:Appropriate for Environment; Casual; Fairly Groomed  Eye Contact:Fair  Speech:Clear and Coherent; Normal Rate  Speech Volume:Normal  Handedness:Right   Mood and Affect  Mood: Euthymic  Affect: Appropriate; Full Range; Congruent   Thought Process  Thought Processes: Coherent; Goal Directed; Linear  Descriptions of Associations:Intact  Orientation:Full (Time, Place and Person)  Thought Content:Logical  Hallucinations:None  Ideas of Reference:None  Suicidal Thoughts:No  Homicidal Thoughts:No   Sensorium  Memory: Immediate Good  Judgment: Fair  Insight: Fair   Manufacturing systems engineer: Fair  Attention Span: Fair  Recall: [employer] of Knowledge: Fair  Language: Fair   Psychomotor Activity  Psychomotor Activity: Normal   Assets  Assets: Armed forces logistics/support/administrative officer; Desire for Improvement; Financial Resources/Insurance; Housing; Intimacy; Leisure Time; Physical Health; Resilience; Social Support   Sleep  Sleep: Poor  Number of hours: No data recorded  No data recorded  Physical Exam: Physical Exam Constitutional:      General: He is not in acute distress.    Appearance: He is not ill-appearing, toxic-appearing or diaphoretic.  Eyes:     General: No scleral icterus. Cardiovascular:     Rate and Rhythm: Normal rate.  Pulmonary:     Effort: Pulmonary effort is normal. No respiratory distress.  Neurological:     Mental Status: He is alert and oriented to person, place, and time.  Psychiatric:        Attention and Perception: Attention and perception normal.        Mood and Affect: Mood and affect normal.        Speech: Speech normal.        Behavior: Behavior normal. Behavior is cooperative.        Thought Content: Thought content normal.        Cognition and Memory: Cognition and memory normal.        Judgment: Judgment normal.    Review of Systems  Constitutional:  Negative for chills and fever.  Respiratory:  Negative for shortness of breath.   Cardiovascular:  Negative for chest pain and palpitations.  Gastrointestinal:  Negative for abdominal pain.  Neurological:  Negative for headaches.   Blood pressure (!) 149/91, pulse 91, temperature 98 F (36.7 C), temperature source Oral, resp. rate 18, SpO2 99 %. There is no height or weight on file to calculate BMI.  Musculoskeletal: Strength & Muscle Tone: within normal limits Gait & Station: normal Patient leans: N/A   [name] Discharge Disposition for Follow up and Recommendations: Based on my evaluation the patient does not appear to have an emergency medical condition  and can be discharged with resources and follow up care in outpatient services for Medication Management and Individual Therapy   Tharon Aquas, NP 04/23/2022, 2:44 PM

## 2022-04-29 ENCOUNTER — Ambulatory Visit (INDEPENDENT_AMBULATORY_CARE_PROVIDER_SITE_OTHER): Payer: Medicaid Other | Admitting: Psychiatry

## 2022-04-29 ENCOUNTER — Encounter (HOSPITAL_COMMUNITY): Payer: Self-pay | Admitting: Psychiatry

## 2022-04-29 DIAGNOSIS — F250 Schizoaffective disorder, bipolar type: Secondary | ICD-10-CM

## 2022-04-29 MED ORDER — ARIPIPRAZOLE 5 MG PO TABS: 5.0000 mg | ORAL_TABLET | Freq: Every day | ORAL | 3 refills | Status: DC

## 2022-04-29 MED ORDER — TRAZODONE HCL 150 MG PO TABS: 150.0000 mg | ORAL_TABLET | Freq: Every day | ORAL | 3 refills | Status: DC
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
## 2022-07-31 ENCOUNTER — Encounter (HOSPITAL_COMMUNITY): Payer: Self-pay | Admitting: Psychiatry

## 2022-07-31 ENCOUNTER — Telehealth (INDEPENDENT_AMBULATORY_CARE_PROVIDER_SITE_OTHER): Payer: Medicaid Other | Admitting: Psychiatry

## 2022-07-31 DIAGNOSIS — F250 Schizoaffective disorder, bipolar type: Secondary | ICD-10-CM | POA: Diagnosis not present

## 2022-07-31 MED ORDER — TRAZODONE HCL 150 MG PO TABS: 150.0000 mg | ORAL_TABLET | Freq: Every day | ORAL | 3 refills | Status: DC

## 2022-07-31 MED ORDER — ARIPIPRAZOLE 5 MG PO TABS: 5.0000 mg | ORAL_TABLET | Freq: Every day | ORAL | 3 refills | Status: DC

## 2022-07-31 NOTE — Progress Notes (Signed)
BH MD/PA/NP OP Progress Note Virtual Visit via Video Note  I connected with Victor Rowe on 07/31/22 at 10:00 AM EST by a video enabled telemedicine application and verified that I am speaking with the correct person using two identifiers.  Location: Patient: Home Provider: Clinic   I discussed the limitations of evaluation and management by telemedicine and the availability of in person appointments. The patient expressed understanding and agreed to proceed.  I provided 30 minutes of non-face-to-face time during this encounter.   07/31/2022 10:17 AM Victor Rowe  MRN:  [id number]  Chief Complaint: " I like my medications"  HPI: 62 year old male seen today for follow-up psychiatric evaluation.He has a psychiatric history of Schizoaffective disorder bipolar type and marijuana use. Currently he is managed on trazodone 150 mg nightly and Abilify 5 mg daily.  He reports his medications are effective in managing his psychiatric conditions.  Today he is well-groomed, pleasant, cooperative, and engaged in conversation.  He informed Probation officer that he finds his medications are effective.  He reports that he no longer experiences visual or auditory hallucinations.  He also notes that his sleep has improved noting that he now sleeps 8 or more hours a night opposed to 2 to 3 hours nightly.  Patient informed Probation officer that his anxiety and depression are improving.  Today provider conducted a GAD-7 and patient scored a 12, at his last visit he scored 13.  He notes that he worries about his finances and his family.  Provider also conducted PHQ-9 and patient scored a 9, at his last visit he scored 15.  He endorses reduced appetite and weight.  Patient notes that he is lost 10 pounds since his last visit.  Today he denies SI/HI/VAH, mania, paranoia.  Patient notes that he is doing well on his current medication regimen.  No medication changes were made today.  Patient agreeable to continue medication as prescribed.   No other concerns noted at this time. Visit Diagnosis:    ICD-10-CM   1. Schizoaffective disorder, bipolar type ([place])  F25.0 ARIPiprazole (ABILIFY) 5 MG tablet    traZODone (DESYREL) 150 MG tablet      Past Psychiatric History: Schizoaffective disorder bipolar type and marijuana use  Past Medical History:  Past Medical History:  Diagnosis Date   Anxiety    Asthma    COPD (chronic obstructive pulmonary disease) ([place])    Depression    Hepatitis    Hep C  Pt states he has taken a 12 week medication for disease   Right rotator cuff tear 03/05/2018   Seizure ([place]) 11/2017    Past Surgical History:  Procedure Laterality Date   SHOULDER ARTHROSCOPY WITH ROTATOR CUFF REPAIR Right 03/05/2018   Procedure: RIGHT SHOULDER ARTHROSCOPY WITH DEBRIDEMENT, [place], ROTATOR CUFF REPAIR;  Surgeon: Marchia Bond, MD;  Location: [place];  Service: Orthopedics;  Laterality: Right;    Family Psychiatric History: Father alcohol use  Family History:  Family History  Problem Relation Age of Onset   Alcoholism Father     Social History:  Social History   Socioeconomic History   Marital status: Single    Spouse name: Not on file   Number of children: Not on file   Years of education: Not on file   Highest education level: Not on file  Occupational History   Not on file  Tobacco Use   Smoking status: Some Days    Packs/day: 0.25    Types: Cigarettes   Smokeless tobacco:  Never   Tobacco comments:    pt states he quit 3 days ago  Vaping Use   Vaping Use: Every day  Substance and Sexual Activity   Alcohol use: Not Currently    Comment: pt states has not had a drink in one month   Drug use: Yes    Types: Marijuana    Comment: currently uses everyday   Sexual activity: Not on file  Other Topics Concern   Not on file  Social History Narrative   Not on file   Social Determinants of Health   Financial Resource Strain: Not on file  Food Insecurity: Not on file   Transportation Needs: Not on file  Physical Activity: Not on file  Stress: Not on file  Social Connections: Not on file    Allergies: No Known Allergies  Metabolic Disorder Labs: Lab Results  Component Value Date   HGBA1C 4.2 (L) 03/30/2015   MPG 74 03/30/2015   Lab Results  Component Value Date   PROLACTIN 5.8 03/30/2015   Lab Results  Component Value Date   CHOL 186 03/30/2015   TRIG 93 03/30/2015   HDL 54 03/30/2015   CHOLHDL 3.4 03/30/2015   VLDL 19 03/30/2015   LDLCALC 113 (H) 03/30/2015   Lab Results  Component Value Date   TSH 0.463 01/01/2019   TSH 0.483 01/18/2016    Therapeutic Level Labs: No results found for: "LITHIUM" No results found for: "VALPROATE" No results found for: "CBMZ"  Current Medications: Current Outpatient Medications  Medication Sig Dispense Refill   albuterol (PROVENTIL HFA;VENTOLIN HFA) 108 (90 Base) MCG/ACT inhaler Inhale 1-2 puffs into the lungs every 6 (six) hours as needed for wheezing or shortness of breath. 3 Inhaler 3   ARIPiprazole (ABILIFY) 5 MG tablet Take 1 tablet (5 mg total) by mouth at bedtime. 30 tablet 3   baclofen (LIORESAL) 10 MG tablet Take 1 tablet (10 mg total) by mouth 3 (three) times daily. As needed for muscle spasm 50 tablet 0   budesonide-formoterol (SYMBICORT) 80-4.5 MCG/ACT inhaler Inhale 2 puffs into the lungs 2 (two) times daily.     loperamide (IMODIUM) 2 MG capsule Take 1 capsule (2 mg total) by mouth 4 (four) times daily as needed for diarrhea or loose stools. 12 capsule 0   ondansetron (ZOFRAN ODT) 8 MG disintegrating tablet Take 1 tablet (8 mg total) by mouth every 8 (eight) hours as needed for nausea or vomiting. 20 tablet 0   traZODone (DESYREL) 150 MG tablet Take 1 tablet (150 mg total) by mouth at bedtime. 30 tablet 3   No current facility-administered medications for this visit.     Musculoskeletal: Strength & Muscle Tone: within normal limits and telehealth visit Gait & Station: normal,  telehealth visit Patient leans: N/A  Psychiatric Specialty Exam: Review of Systems  There were no vitals taken for this visit.There is no height or weight on file to calculate BMI.  General Appearance: Well Groomed  Eye Contact:  Good  Speech:  Clear and Coherent and Normal Rate  Volume:  Normal  Mood:  Euthymic  Affect:  Appropriate and Congruent  Thought Process:  Coherent, Goal Directed, and Linear  Orientation:  Full (Time, Place, and Person)  Thought Content: WDL and Logical   Suicidal Thoughts:  No  Homicidal Thoughts:  No  Memory:  Immediate;   Good Recent;   Good Remote;   Good  Judgement:  Good  Insight:  Good  Psychomotor Activity:  Normal  Concentration:  Concentration: Good and Attention Span: Good  Recall:  Good  Fund of Knowledge: Good  Language: Good  Akathisia:  No  Handed:  Left  AIMS (if indicated): not done  Assets:  Communication Skills Desire for Improvement Housing Leisure Time Physical Health Social Support  ADL's:  Intact  Cognition: WNL  Sleep:  Good   Screenings: AIMS    Flowsheet Row Admission (Discharged) from 03/29/2015 in [place] [facility]  AIMS Total Score 0      AUDIT    Flowsheet Row Office Visit from 04/29/2022 in [facility] Admission (Discharged) from 03/29/2015 in [place] [facility]  Alcohol Use Disorder Identification Test Final Score (AUDIT) 3 4      GAD-7    Flowsheet Row Video Visit from 07/31/2022 in [facility] Office Visit from 04/29/2022 in [facility]  Total GAD-7 Score 12 13      PHQ2-9    Flowsheet Row Video Visit from 07/31/2022 in [facility] Office Visit from 04/29/2022 in [place]  PHQ-2 Total Score 3 3  PHQ-9 Total Score 9 15      [place] ED from 08/29/2020 in [facility]  [place] No Risk        Assessment and Plan: Patient notes that he is doing well on his current medication regimen.  No medication changes were made today.  Patient agreeable to continue medication as prescribed.  1. Schizoaffective disorder, bipolar type ([facility])  Continue- ARIPiprazole (ABILIFY) 5 MG tablet; Take 1 tablet (5 mg total) by mouth at bedtime.  Dispense: 30 tablet; Refill: 3 Continue- traZODone (DESYREL) 150 MG tablet; Take 1 tablet (150 mg total) by mouth at bedtime.  Dispense: 30 tablet; Refill: 3   Collaboration of Care: Collaboration of Care: Other provider involved in patient's care AEB PCP  Patient/Guardian was advised Release of Information must be obtained prior to any record release in order to collaborate their care with an outside provider. Patient/Guardian was advised if they have not already done so to contact the registration department to sign all necessary forms in order for [place] to release information regarding their care.   Consent: Patient/Guardian gives verbal consent for treatment and assignment of benefits for services provided during this visit. Patient/Guardian expressed understanding and agreed to proceed.   Follow-up in 3 months   Salley Slaughter, NP 07/31/2022, 10:17 AM

## 2022-10-16 ENCOUNTER — Encounter (HOSPITAL_COMMUNITY): Payer: Self-pay | Admitting: Psychiatry

## 2022-10-16 ENCOUNTER — Telehealth (INDEPENDENT_AMBULATORY_CARE_PROVIDER_SITE_OTHER): Payer: Medicaid Other | Admitting: Psychiatry

## 2022-10-16 ENCOUNTER — Telehealth (HOSPITAL_COMMUNITY): Payer: Self-pay | Admitting: *Deleted

## 2022-10-16 DIAGNOSIS — F250 Schizoaffective disorder, bipolar type: Secondary | ICD-10-CM | POA: Diagnosis not present

## 2022-10-16 MED ORDER — TRAZODONE HCL 150 MG PO TABS: 150.0000 mg | ORAL_TABLET | Freq: Every day | ORAL | 3 refills | Status: DC

## 2022-10-16 MED ORDER — ARIPIPRAZOLE 5 MG PO TABS: 5.0000 mg | ORAL_TABLET | Freq: Every day | ORAL | 3 refills | Status: DC

## 2022-10-16 NOTE — Progress Notes (Signed)
BH MD/PA/NP OP Progress Note Virtual Visit via Video Note  I connected with Victor Rowe on 10/16/22 at  1:30 PM EDT by a video enabled telemedicine application and verified that I am speaking with the correct person using two identifiers.  Location: Patient: Dr. [name] Provider: Clinic   I discussed the limitations of evaluation and management by telemedicine and the availability of in person appointments. The patient expressed understanding and agreed to proceed.  I provided 30 minutes of non-face-to-face time during this encounter.   10/16/2022 1:33 PM Victor Rowe  MRN:  [id number]  Chief Complaint: " I am doing okay  HPI: 62 year old male seen today for follow-up psychiatric evaluation.He has a psychiatric history of Schizoaffective disorder bipolar type and marijuana use. Currently he is managed on trazodone 150 mg nightly and Abilify 5 mg daily.  He reports his medications are effective in managing his psychiatric conditions.  Today he is well-groomed, pleasant, cooperative, and engaged in conversation.  He informed Clinical research associate that he is doing good.  He notes that he is spending the day with one of his friends and is currently at a doctor's appointment with them.  Patient reports that his mood is stable and reports that his anxiety depression continues to be well-managed.  Provider conducted GAD-7 patient a 2, at his last visit he scored a 5.  Provider also conducted PHQ-9 he scored a 4, at his last visit he scored a 9.  He endorses adequate sleep 5 to 6 hours and appetite.  Patient informed Clinical research associate that his hallucinations has improved greatly.  He notes that he has auditory hallucinations infrequently.  He notes that at times when he is outside he hears something that sounds similar to an animal.  Today he denies SI/HI/VH, mania, paranoia.    Patient has not had labs drawn in over a year.  Today CBC, CMP, lipid panel, HgbA1c, thyroid panel and prolactin level ordered.  He informed Clinical research associate  that he is doing well on his current medication regimen.  No medication changes were made today.  Patient agreeable to continue medication as prescribed.  No other concerns noted at this time. Visit Diagnosis:    ICD-10-CM   1. Schizoaffective disorder, bipolar type (HCC)  F25.0 ARIPiprazole (ABILIFY) 5 MG tablet    traZODone (DESYREL) 150 MG tablet    CBC w/Diff/Platelet    Comprehensive Metabolic Panel (CMET)    Hepatic function panel    Lipid Profile    Thyroid Panel With TSH    HgB A1c    Prolactin      Past Psychiatric History: Schizoaffective disorder bipolar type and marijuana use  Past Medical History:  Past Medical History:  Diagnosis Date   Anxiety    Asthma    COPD (chronic obstructive pulmonary disease) (HCC)    Depression    Hepatitis    Hep C  Pt states he has taken a 12 week medication for disease   Right rotator cuff tear 03/05/2018   Seizure (HCC) 11/2017    Past Surgical History:  Procedure Laterality Date   SHOULDER ARTHROSCOPY WITH ROTATOR CUFF REPAIR Right 03/05/2018   Procedure: RIGHT SHOULDER ARTHROSCOPY WITH DEBRIDEMENT, ACROIOPLASTY, ROTATOR CUFF REPAIR;  Surgeon: Teryl Lucy, MD;  Location: [facility];  Service: Orthopedics;  Laterality: Right;    Family Psychiatric History: Father alcohol use  Family History:  Family History  Problem Relation Age of Onset   Alcoholism Father     Social History:  Social History  Socioeconomic History   Marital status: Single    Spouse name: Not on file   Number of children: Not on file   Years of education: Not on file   Highest education level: Not on file  Occupational History   Not on file  Tobacco Use   Smoking status: Some Days    Packs/day: .25    Types: Cigarettes   Smokeless tobacco: Never   Tobacco comments:    pt states he quit 3 days ago  Vaping Use   Vaping Use: Every day  Substance and Sexual Activity   Alcohol use: Not Currently    Comment: pt states has not had  a drink in one month   Drug use: Yes    Types: Marijuana    Comment: currently uses everyday   Sexual activity: Not on file  Other Topics Concern   Not on file  Social History Narrative   Not on file   Social Determinants of Health   Financial Resource Strain: Not on file  Food Insecurity: Not on file  Transportation Needs: Not on file  Physical Activity: Not on file  Stress: Not on file  Social Connections: Not on file    Allergies: No Known Allergies  Metabolic Disorder Labs: Lab Results  Component Value Date   HGBA1C 4.2 (L) 03/30/2015   MPG 74 03/30/2015   Lab Results  Component Value Date   PROLACTIN 5.8 03/30/2015   Lab Results  Component Value Date   CHOL 186 03/30/2015   TRIG 93 03/30/2015   HDL 54 03/30/2015   CHOLHDL 3.4 03/30/2015   VLDL 19 03/30/2015   LDLCALC 113 (H) 03/30/2015   Lab Results  Component Value Date   TSH 0.463 01/01/2019   TSH 0.483 01/18/2016    Therapeutic Level Labs: No results found for: "LITHIUM" No results found for: "VALPROATE" No results found for: "CBMZ"  Current Medications: Current Outpatient Medications  Medication Sig Dispense Refill   albuterol (PROVENTIL HFA;VENTOLIN HFA) 108 (90 Base) MCG/ACT inhaler Inhale 1-2 puffs into the lungs every 6 (six) hours as needed for wheezing or shortness of breath. 3 Inhaler 3   ARIPiprazole (ABILIFY) 5 MG tablet Take 1 tablet (5 mg total) by mouth at bedtime. 30 tablet 3   baclofen (LIORESAL) 10 MG tablet Take 1 tablet (10 mg total) by mouth 3 (three) times daily. As needed for muscle spasm 50 tablet 0   budesonide-formoterol (SYMBICORT) 80-4.5 MCG/ACT inhaler Inhale 2 puffs into the lungs 2 (two) times daily.     loperamide (IMODIUM) 2 MG capsule Take 1 capsule (2 mg total) by mouth 4 (four) times daily as needed for diarrhea or loose stools. 12 capsule 0   ondansetron (ZOFRAN ODT) 8 MG disintegrating tablet Take 1 tablet (8 mg total) by mouth every 8 (eight) hours as needed for  nausea or vomiting. 20 tablet 0   traZODone (DESYREL) 150 MG tablet Take 1 tablet (150 mg total) by mouth at bedtime. 30 tablet 3   No current facility-administered medications for this visit.     Musculoskeletal: Strength & Muscle Tone: within normal limits and telehealth visit Gait & Station: normal, telehealth visit Patient leans: N/A  Psychiatric Specialty Exam: Review of Systems  There were no vitals taken for this visit.There is no height or weight on file to calculate BMI.  General Appearance: Well Groomed  Eye Contact:  Good  Speech:  Clear and Coherent and Normal Rate  Volume:  Normal  Mood:  Euthymic  Affect:  Appropriate and Congruent  Thought Process:  Coherent, Goal Directed, and Linear  Orientation:  Full (Time, Place, and Person)  Thought Content: WDL and Logical , reports infrequent auditory hallucinations  Suicidal Thoughts:  No  Homicidal Thoughts:  No  Memory:  Immediate;   Good Recent;   Good Remote;   Good  Judgement:  Good  Insight:  Good  Psychomotor Activity:  Normal  Concentration:  Concentration: Good and Attention Span: Good  Recall:  Good  Fund of Knowledge: Good  Language: Good  Akathisia:  No  Handed:  Left  AIMS (if indicated): not done  Assets:  Communication Skills Desire for Improvement Housing Leisure Time Physical Health Social Support  ADL's:  Intact  Cognition: WNL  Sleep:  Good   Screenings: AIMS    Flowsheet Row [facility]  AIMS Total Score 0      AUDIT    Flowsheet Row Office Visit from 04/29/2022 in [facility]  Alcohol Use Disorder Identification Test Final Score (AUDIT) 3 4      GAD-7    Flowsheet Row Video Visit from 10/16/2022 in [facility] Video Visit from 07/31/2022 in [facility] Office Visit from 04/29/2022 in [facility]  Total GAD-7 Score 2 12 13       PHQ2-9    Flowsheet Row Video Visit from 10/16/2022 in [facility] Video Visit from 07/31/2022 in [facility] Office Visit from 04/29/2022 in [facility]  PHQ-2 Total Score 0 3 3  PHQ-9 Total Score 4 9 15       Flowsheet Row ED from 08/29/2020 in [facility] RISK CATEGORY No Risk        Assessment and Plan: Patient notes that he is doing well on his current medication regimen.  No medication changes were made today. Patient has not had labs drawn in over a year.  Today CBC, CMP, lipid panel, HgbA1c, thyroid panel and prolactin level ordered.   1. Schizoaffective disorder, bipolar type (HCC)  Continue- ARIPiprazole (ABILIFY) 5 MG tablet; Take 1 tablet (5 mg total) by mouth at bedtime.  Dispense: 30 tablet; Refill: 3 Continue- traZODone (DESYREL) 150 MG tablet; Take 1 tablet (150 mg total) by mouth at bedtime.  Dispense: 30 tablet; Refill: 3 - CBC w/Diff/Platelet - Comprehensive Metabolic Panel (CMET) - Hepatic function panel - Lipid Profile - Thyroid Panel With TSH - HgB A1c - Prolactin    Collaboration of Care: Collaboration of Care: Other provider involved in patient's care AEB PCP  Patient/Guardian was advised Release of Information must be obtained prior to any record release in order to collaborate their care with an outside provider. Patient/Guardian was advised if they have not already done so to contact the registration department to sign all necessary forms in order for [place] to release information regarding their care.   Consent: Patient/Guardian gives verbal consent for treatment and assignment of benefits for services provided during this visit. Patient/Guardian expressed understanding and agreed to proceed.   Follow-up in 3  months   Shanna Cisco, NP 10/16/2022, 1:33 PM

## 2022-10-16 NOTE — Telephone Encounter (Signed)
Called [employer] for prior authorization of Abilify 5mg . Approved per Tamika until 10/11/23. WU#[id number]. Called to notify pharmacy.

## 2022-12-18 ENCOUNTER — Telehealth (INDEPENDENT_AMBULATORY_CARE_PROVIDER_SITE_OTHER): Payer: MEDICAID | Admitting: Psychiatry

## 2022-12-18 ENCOUNTER — Encounter (HOSPITAL_COMMUNITY): Payer: Self-pay | Admitting: Psychiatry

## 2022-12-18 DIAGNOSIS — F250 Schizoaffective disorder, bipolar type: Secondary | ICD-10-CM

## 2022-12-18 MED ORDER — ARIPIPRAZOLE 5 MG PO TABS
5.0000 mg | ORAL_TABLET | Freq: Every day | ORAL | 3 refills | Status: AC
Start: 2022-12-18 — End: ?

## 2022-12-18 MED ORDER — TRAZODONE HCL 150 MG PO TABS
150.0000 mg | ORAL_TABLET | Freq: Every day | ORAL | 3 refills | Status: AC
Start: 2022-12-18 — End: ?

## 2022-12-18 NOTE — Progress Notes (Signed)
BH MD/PA/NP OP Progress Note Virtual Visit via Video Note  I connected with Victor Rowe on 12/18/22 at  2:30 PM EDT by a video enabled telemedicine application and verified that I am speaking with the correct person using two identifiers.  Location: Patient: Dr. [name] Provider: Clinic   I discussed the limitations of evaluation and management by telemedicine and the availability of in person appointments. The patient expressed understanding and agreed to proceed.  I provided 30 minutes of non-face-to-face time during this encounter.   12/18/2022 2:55 PM Victor Rowe  MRN:  [id number]  Chief Complaint: " I am doing okay  HPI: 62 year old male seen today for follow-up psychiatric evaluation.He has a psychiatric history of Schizoaffective disorder bipolar type and marijuana use. Currently he is managed on trazodone 150 mg nightly and Abilify 5 mg daily.  He reports his medications are effective in managing his psychiatric conditions.  Today he is well-groomed, pleasant, cooperative, and engaged in conversation. Patients camera turned off today.  He informed Clinical research associate that he is doing good.  He notes that his mood is stable and  denies anxiety and depression. Today provider conducted a GAD 7 and patient scored a 0, his last visit he scored a 2. Provider also conducted a PHQ 9 and patient scored a 1, at his last visit he scored a 4. Patient notes at times  he has passive SI but he denies wanting to harm himself. Today he denies SI/HI/VAH, mania or paranoia.    Patient has not had labs drawn in over a year.  Provider ordered labs at his last visit and encouraged him to go have them drawn. He endorsed understanding and agreed. No medication changes were made today.  Patient agreeable to continue medication as prescribed.  No other concerns noted at this time. Visit Diagnosis:    ICD-10-CM   1. Schizoaffective disorder, bipolar type (HCC)  F25.0 ARIPiprazole (ABILIFY) 5 MG tablet    traZODone  (DESYREL) 150 MG tablet      Past Psychiatric History: Schizoaffective disorder bipolar type and marijuana use  Past Medical History:  Past Medical History:  Diagnosis Date   Anxiety    Asthma    COPD (chronic obstructive pulmonary disease) (HCC)    Depression    Hepatitis    Hep C  Pt states he has taken a 12 week medication for disease   Right rotator cuff tear 03/05/2018   Seizure (HCC) 11/2017    Past Surgical History:  Procedure Laterality Date   SHOULDER ARTHROSCOPY WITH ROTATOR CUFF REPAIR Right 03/05/2018   Procedure: RIGHT SHOULDER ARTHROSCOPY WITH DEBRIDEMENT, ACROIOPLASTY, ROTATOR CUFF REPAIR;  Surgeon: Teryl Lucy, MD;  Location: [facility];  Service: Orthopedics;  Laterality: Right;    Family Psychiatric History: Father alcohol use  Family History:  Family History  Problem Relation Age of Onset   Alcoholism Father     Social History:  Social History   Socioeconomic History   Marital status: Single    Spouse name: Not on file   Number of children: Not on file   Years of education: Not on file   Highest education level: Not on file  Occupational History   Not on file  Tobacco Use   Smoking status: Some Days    Packs/day: .25    Types: Cigarettes   Smokeless tobacco: Never   Tobacco comments:    pt states he quit 3 days ago  Vaping Use   Vaping Use: Every day  Substance and Sexual Activity   Alcohol use: Not Currently    Comment: pt states has not had a drink in one month   Drug use: Yes    Types: Marijuana    Comment: currently uses everyday   Sexual activity: Not on file  Other Topics Concern   Not on file  Social History Narrative   Not on file   Social Determinants of Health   Financial Resource Strain: Not on file  Food Insecurity: Not on file  Transportation Needs: Not on file  Physical Activity: Not on file  Stress: Not on file  Social Connections: Not on file    Allergies: No Known Allergies  Metabolic  Disorder Labs: Lab Results  Component Value Date   HGBA1C 4.2 (L) 03/30/2015   MPG 74 03/30/2015   Lab Results  Component Value Date   PROLACTIN 5.8 03/30/2015   Lab Results  Component Value Date   CHOL 186 03/30/2015   TRIG 93 03/30/2015   HDL 54 03/30/2015   CHOLHDL 3.4 03/30/2015   VLDL 19 03/30/2015   LDLCALC 113 (H) 03/30/2015   Lab Results  Component Value Date   TSH 0.463 01/01/2019   TSH 0.483 01/18/2016    Therapeutic Level Labs: No results found for: "LITHIUM" No results found for: "VALPROATE" No results found for: "CBMZ"  Current Medications: Current Outpatient Medications  Medication Sig Dispense Refill   albuterol (PROVENTIL HFA;VENTOLIN HFA) 108 (90 Base) MCG/ACT inhaler Inhale 1-2 puffs into the lungs every 6 (six) hours as needed for wheezing or shortness of breath. 3 Inhaler 3   ARIPiprazole (ABILIFY) 5 MG tablet Take 1 tablet (5 mg total) by mouth at bedtime. 30 tablet 3   baclofen (LIORESAL) 10 MG tablet Take 1 tablet (10 mg total) by mouth 3 (three) times daily. As needed for muscle spasm 50 tablet 0   budesonide-formoterol (SYMBICORT) 80-4.5 MCG/ACT inhaler Inhale 2 puffs into the lungs 2 (two) times daily.     loperamide (IMODIUM) 2 MG capsule Take 1 capsule (2 mg total) by mouth 4 (four) times daily as needed for diarrhea or loose stools. 12 capsule 0   ondansetron (ZOFRAN ODT) 8 MG disintegrating tablet Take 1 tablet (8 mg total) by mouth every 8 (eight) hours as needed for nausea or vomiting. 20 tablet 0   traZODone (DESYREL) 150 MG tablet Take 1 tablet (150 mg total) by mouth at bedtime. 30 tablet 3   No current facility-administered medications for this visit.     Musculoskeletal: Strength & Muscle Tone: within normal limits and telehealth visit camera off Gait & Station: normal, telehealth visit , camera off Patient leans: N/A  Psychiatric Specialty Exam: Review of Systems  There were no vitals taken for this visit.There is no height or  weight on file to calculate BMI.  General Appearance:  Unable to assess patient's camera turned off  Eye Contact:   Unable to assess patient's camera turned off  Speech:  Clear and Coherent and Normal Rate  Volume:  Normal  Mood:  Euthymic  Affect:  Appropriate and Congruent  Thought Process:  Coherent, Goal Directed, and Linear  Orientation:  Full (Time, Place, and Person)  Thought Content: WDL and Logical   Suicidal Thoughts:  Yes.  without intent/plan  Homicidal Thoughts:  Yes.  without intent/plan  Memory:  Immediate;   Good Recent;   Good Remote;   Good  Judgement:  Good  Insight:  Good  Psychomotor Activity:   Unable to assess patient's  camera turned off  Concentration:  Concentration: Good and Attention Span: Good  Recall:  Good  Fund of Knowledge: Good  Language: Good  Akathisia:  No  Handed:  Left  AIMS (if indicated): not done  Assets:  Communication Skills Desire for Improvement Housing Leisure Time Physical Health Social Support  ADL's:  Intact  Cognition: WNL  Sleep:  Good   Screenings: AIMS    Flowsheet Row [facility]  AIMS Total Score 0      AUDIT    Flowsheet Row Office Visit from 04/29/2022 in [facility] Ad[facility]  Alcohol Use Disorder Identification Test Final Score (AUDIT) 3 4      GAD-7    Flowsheet Row Video Visit from 12/18/2022 in [facility] Video Visit from 10/16/2022 in [facility] Video Visit from 07/31/2022 in [facility] Office Visit from 04/29/2022 in [facility]  Total GAD-7 Score 0 2 12 13       PHQ2-9    Flowsheet Row Video Visit from 12/18/2022 in [facility] Video Visit from 10/16/2022 in [facility] Video Visit from 07/31/2022 in [facility] Office Visit from 04/29/2022 in [facility]  PHQ-2 Total Score 0 0 3 3  PHQ-9 Total Score 1 4 9 15       Flowsheet Row Video Visit from 12/18/2022 in [facility] ED from 08/29/2020 in [facility] RISK CATEGORY Error: Q3, 4, or 5 should not be populated when Q2 is No No Risk        Assessment and Plan: Patient notes that he is doing well on his current medication regimen. Patient has not had labs drawn in over a year.  Provider ordered labs at his last visit and encouraged him to go have them drawn. He endorsed understanding and agreed. No medication changes were made today.  Patient agreeable to continue medication as prescribed.    1. Schizoaffective disorder, bipolar type (HCC)  Continue- ARIPiprazole (ABILIFY) 5 MG tablet; Take 1 tablet (5 mg total) by mouth at bedtime.  Dispense: 30 tablet; Refill: 3 Continue- traZODone (DESYREL) 150 MG tablet; Take 1 tablet (150 mg total) by mouth at bedtime.  Dispense: 30 tablet; Refill: 3  Collaboration of Care: Collaboration of Care: Other provider involved in patient's care AEB PCP  Patient/Guardian was advised Release of Information must be obtained prior to any record release in order to collaborate their care with an outside provider. Patient/Guardian was advised if they have not already done so to contact the registration department to sign all necessary forms in order for [place] to release information regarding their care.   Consent: Patient/Guardian gives verbal consent for treatment and assignment of benefits for services provided during this visit. Patient/Guardian expressed understanding and agreed to proceed.   Follow-up in 3 months   Shanna Cisco, NP 12/18/2022, 2:55 PM

## 2023-03-19 ENCOUNTER — Telehealth (HOSPITAL_COMMUNITY): Payer: MEDICAID | Admitting: Psychiatry

## 2023-03-19 ENCOUNTER — Encounter (HOSPITAL_COMMUNITY): Payer: Self-pay

## 2023-03-27 ENCOUNTER — Encounter (HOSPITAL_COMMUNITY): Payer: MEDICAID | Admitting: Psychiatry

## 2023-04-01 ENCOUNTER — Encounter (HOSPITAL_COMMUNITY): Payer: MEDICAID | Admitting: Psychiatry

## 2023-08-13 ENCOUNTER — Institutional Professional Consult (permissible substitution): Payer: MEDICAID | Admitting: Nurse Practitioner

## 2023-10-09 ENCOUNTER — Ambulatory Visit (INDEPENDENT_AMBULATORY_CARE_PROVIDER_SITE_OTHER): Payer: MEDICAID | Admitting: Nurse Practitioner

## 2023-10-09 ENCOUNTER — Encounter: Payer: Self-pay | Admitting: Nurse Practitioner

## 2023-10-09 VITALS — BP 122/80 | HR 109 | Ht 72.0 in | Wt 241.0 lb

## 2023-10-09 DIAGNOSIS — E66811 Obesity, class 1: Secondary | ICD-10-CM | POA: Diagnosis not present

## 2023-10-09 DIAGNOSIS — G4710 Hypersomnia, unspecified: Secondary | ICD-10-CM | POA: Diagnosis not present

## 2023-10-09 DIAGNOSIS — Z6832 Body mass index (BMI) 32.0-32.9, adult: Secondary | ICD-10-CM

## 2023-10-09 DIAGNOSIS — R0681 Apnea, not elsewhere classified: Secondary | ICD-10-CM

## 2023-10-09 DIAGNOSIS — R0683 Snoring: Secondary | ICD-10-CM | POA: Diagnosis not present

## 2023-10-09 DIAGNOSIS — F17210 Nicotine dependence, cigarettes, uncomplicated: Secondary | ICD-10-CM

## 2023-10-09 NOTE — Progress Notes (Unsigned)
 @Patient  ID: Victor Rowe, male    DOB: April 09, 1961, 63 y.o.   MRN: [id number]  Chief Complaint  Patient presents with   Consult    Sleep consult     Referring provider: Tretha Fu, MD  HPI: 63 year old male, active smoker referred for sleep consult. Past medical history significant for chronic hepatitis C, cirrhosis, ETOH abuse, polysubstance abuse, schizoaffective disorder.  TEST/EVENTS:   10/09/2023: Today - sleep consult Discussed the use of AI scribe software for clinical note transcription with the patient, who gave verbal consent to proceed.  History of Present Illness   Victor Rowe is a 63 year old male who presents with sleep disturbances and suspected sleep apnea.  He experiences frequent awakenings during the night, and a friend has observed episodes of apnea during sleep, prompting him to wake. He acknowledges snoring at night but denies waking up choking or gasping for air, feeling short of breath, or experiencing morning headaches.   He is on disability and does not operate heavy machinery. He consumes alcohol two to three times a month and drinks one cup of coffee daily. Weight has been relatively stable. No history of sleepwalking or falling asleep while driving. He experiences occasional daytime tiredness, but not consistently.   He goes to bed about 10 pm. Falls asleep quickly. Wakes often. Gets up between 5-7 am. Never had a prior sleep study. He does smoke some days. Lives alone.   [place] [address]      No Known Allergies  Immunization History  Administered Date(s) Administered   Influenza,inj,Quad PF,6+ Mos 04/04/2015   Pneumococcal Polysaccharide-23 01/19/2016    Past Medical History:  Diagnosis Date   Anxiety    Asthma    COPD (chronic obstructive pulmonary disease) (HCC)    Depression    Hepatitis    Hep C  Pt states he has taken a 12 week medication for disease   Right rotator cuff tear 03/05/2018   Seizure (HCC) 11/2017    Tobacco  History: Social History   Tobacco Use  Smoking Status Some Days   Current packs/day: 0.25   Types: Cigarettes  Smokeless Tobacco Never  Tobacco Comments   Smokes 1-2 packs a day. [facility] 10/09/2023   Ready to quit: Not Answered Counseling given: Not Answered Tobacco comments: Smokes 1-2 packs a day. [facility] 10/09/2023   Outpatient Medications Prior to Visit  Medication Sig Dispense Refill   albuterol  (PROVENTIL  HFA;VENTOLIN  HFA) 108 (90 Base) MCG/ACT inhaler Inhale 1-2 puffs into the lungs every 6 (six) hours as needed for wheezing or shortness of breath. 3 Inhaler 3   ARIPiprazole  (ABILIFY ) 5 MG tablet Take 1 tablet (5 mg total) by mouth at bedtime. 30 tablet 3   baclofen  (LIORESAL ) 10 MG tablet Take 1 tablet (10 mg total) by mouth 3 (three) times daily. As needed for muscle spasm 50 tablet 0   budesonide -formoterol (SYMBICORT) 80-4.5 MCG/ACT inhaler Inhale 2 puffs into the lungs 2 (two) times daily.     loperamide  (IMODIUM ) 2 MG capsule Take 1 capsule (2 mg total) by mouth 4 (four) times daily as needed for diarrhea or loose stools. 12 capsule 0   ondansetron  (ZOFRAN  ODT) 8 MG disintegrating tablet Take 1 tablet (8 mg total) by mouth every 8 (eight) hours as needed for nausea or vomiting. 20 tablet 0   traZODone  (DESYREL ) 150 MG tablet Take 1 tablet (150 mg total) by mouth at bedtime. 30 tablet 3   No facility-administered medications prior to visit.  Review of Systems:   Constitutional: No weight loss or gain, night sweats, fevers, chills,or lassitude. +occasional fatigue  HEENT: No headaches, difficulty swallowing, tooth/dental problems, or sore throat. No sneezing, itching, ear ache, nasal congestion, or post nasal drip CV:  No chest pain, orthopnea, PND Resp: +snoring, witnessed apneas. No shortness of breath with exertion or at rest. No excess mucus or change in color of mucus. No productive or non-productive. No hemoptysis. No wheezing.   GI:  No heartburn,  indigestion GU: No nocturia  Skin: No rash, lesions, ulcerations MSK:  No joint pain or swelling.   Neuro: No dizziness or lightheadedness.  Psych: +stable depression, anxiety. Mood stable. +sleep disturbance     Physical Exam:  BP 122/80 (BP Location: Right Arm, Patient Position: Sitting, Cuff Size: Normal)   Pulse (!) 109   Ht 6' (1.829 m)   Wt 241 lb (109.3 kg)   SpO2 99%   BMI 32.69 kg/m   GEN: Pleasant, interactive, well-appearing; obese; in no acute distress HEENT:  Normocephalic and atraumatic. PERRLA. Sclera white. Nasal turbinates pink, moist and patent bilaterally. No rhinorrhea present. Oropharynx pink and moist, without exudate or edema. No lesions, ulcerations, or postnasal drip. Mallampati III NECK:  Supple w/ fair ROM. No lymphadenopathy.   CV: RRR, no m/r/g, no peripheral edema. Pulses intact, +2 bilaterally. No cyanosis, pallor or clubbing. PULMONARY:  Unlabored, regular breathing. Clear bilaterally A&P w/o wheezes/rales/rhonchi. No accessory muscle use.  GI: BS present and normoactive. Soft, non-tender to palpation.  MSK: No erythema, warmth or tenderness. Cap refil <2 sec all extrem.  Neuro: A/Ox3. No focal deficits noted.   Skin: Warm, no lesions or rashe Psych: Normal affect and behavior. Judgement and thought content appropriate.     Lab Results:  CBC    Component Value Date/Time   WBC 7.3 01/01/2019 0515   RBC 4.14 (L) 01/01/2019 0515   HGB 13.0 01/01/2019 0515   HCT 37.9 (L) 01/01/2019 0515   PLT 166 01/01/2019 0515   MCV 91.5 01/01/2019 0515   MCH 31.4 01/01/2019 0515   MCHC 34.3 01/01/2019 0515   RDW 13.8 01/01/2019 0515   LYMPHSABS 1.8 01/01/2019 0515   MONOABS 0.4 01/01/2019 0515   EOSABS 0.1 01/01/2019 0515   BASOSABS 0.1 01/01/2019 0515    BMET    Component Value Date/Time   NA 141 01/02/2019 0555   K 4.3 01/02/2019 0555   CL 112 (H) 01/02/2019 0555   CO2 25 01/02/2019 0555   GLUCOSE 106 (H) 01/02/2019 0555   BUN 9 01/02/2019  0555   CREATININE 1.24 01/02/2019 0555   CREATININE 1.05 03/21/2015 1127   CALCIUM 8.5 (L) 01/02/2019 0555   GFRNONAA >60 01/02/2019 0555   GFRNONAA 80 03/21/2015 1127   GFRAA >60 01/02/2019 0555   GFRAA >89 03/21/2015 1127    BNP No results found for: "BNP"   Imaging:  No results found.  Administration History     None           No data to display          No results found for: "NITRICOXIDE"      Assessment & Plan:   Loud snoring He has snoring, daytime sleepiness, nocturnal apneic events. BMI 32. Given this,  I am concerned he could have sleep disordered breathing with obstructive sleep apnea. He will need sleep study for further evaluation.    - discussed how weight can impact sleep and risk for sleep disordered breathing - discussed options to  assist with weight loss: combination of diet modification, cardiovascular and strength training exercises   - had an extensive discussion regarding the adverse health consequences related to untreated sleep disordered breathing - specifically discussed the risks for hypertension, coronary artery disease, cardiac dysrhythmias, cerebrovascular disease, and diabetes - lifestyle modification discussed   - discussed how sleep disruption can increase risk of accidents, particularly when driving - safe driving practices were discussed  Patient Instructions  Given your symptoms, I am concerned that you may have sleep disordered breathing with sleep apnea. You will need a sleep study for further evaluation. Someone will contact you to schedule this.   We discussed how untreated sleep apnea puts an individual at risk for cardiac arrhthymias, pulm HTN, DM, stroke and increases their risk for daytime accidents. We also briefly reviewed treatment options including weight loss, side sleeping position, oral appliance, CPAP therapy or referral to ENT for possible surgical options  Use caution when driving and pull over if you  become sleepy.  Follow up in 6 weeks with Katie Pearl Bents,NP to go over sleep study results, or sooner, if needed. Friday PM virtual clinic preferred       Obesity (BMI 30.0-34.9) BMI 32. Healthy weight loss encouraged   Advised if symptoms do not improve or worsen, to please contact office for sooner follow up or seek emergency care.   I spent 35 minutes of dedicated to the care of this patient on the date of this encounter to include pre-visit review of records, face-to-face time with the patient discussing conditions above, post visit ordering of testing, clinical documentation with the electronic health record, making appropriate referrals as documented, and communicating necessary findings to members of the patients care team.  Roetta Clarke, NP 10/10/2023  Pt aware and understands NP's role.

## 2023-10-09 NOTE — Patient Instructions (Signed)
 Given your symptoms, I am concerned that you may have sleep disordered breathing with sleep apnea. You will need a sleep study for further evaluation. Someone will contact you to schedule this.   We discussed how untreated sleep apnea puts an individual at risk for cardiac arrhthymias, pulm HTN, DM, stroke and increases their risk for daytime accidents. We also briefly reviewed treatment options including weight loss, side sleeping position, oral appliance, CPAP therapy or referral to ENT for possible surgical options  Use caution when driving and pull over if you become sleepy.  Follow up in 6 weeks with Katie Emmanuell Kantz,NP to go over sleep study results, or sooner, if needed. Friday PM virtual clinic preferred

## 2023-10-10 ENCOUNTER — Encounter: Payer: Self-pay | Admitting: Nurse Practitioner

## 2023-10-10 DIAGNOSIS — E66811 Obesity, class 1: Secondary | ICD-10-CM | POA: Insufficient documentation

## 2023-10-10 DIAGNOSIS — R0683 Snoring: Secondary | ICD-10-CM | POA: Insufficient documentation

## 2023-10-10 NOTE — Assessment & Plan Note (Signed)
 He has snoring, daytime sleepiness, nocturnal apneic events. BMI 32. Given this,  I am concerned he could have sleep disordered breathing with obstructive sleep apnea. He will need sleep study for further evaluation.    - discussed how weight can impact sleep and risk for sleep disordered breathing - discussed options to assist with weight loss: combination of diet modification, cardiovascular and strength training exercises   - had an extensive discussion regarding the adverse health consequences related to untreated sleep disordered breathing - specifically discussed the risks for hypertension, coronary artery disease, cardiac dysrhythmias, cerebrovascular disease, and diabetes - lifestyle modification discussed   - discussed how sleep disruption can increase risk of accidents, particularly when driving - safe driving practices were discussed  Patient Instructions  Given your symptoms, I am concerned that you may have sleep disordered breathing with sleep apnea. You will need a sleep study for further evaluation. Someone will contact you to schedule this.   We discussed how untreated sleep apnea puts an individual at risk for cardiac arrhthymias, pulm HTN, DM, stroke and increases their risk for daytime accidents. We also briefly reviewed treatment options including weight loss, side sleeping position, oral appliance, CPAP therapy or referral to ENT for possible surgical options  Use caution when driving and pull over if you become sleepy.  Follow up in 6 weeks with Katie Sakoya Win,NP to go over sleep study results, or sooner, if needed. Friday PM virtual clinic preferred

## 2023-10-10 NOTE — Assessment & Plan Note (Signed)
 BMI 32. Healthy weight loss encouraged.

## 2023-12-03 ENCOUNTER — Telehealth: Payer: Self-pay

## 2023-12-03 ENCOUNTER — Ambulatory Visit: Payer: MEDICAID | Admitting: Nurse Practitioner

## 2023-12-03 NOTE — Telephone Encounter (Signed)
 Called patient to inform them we need to reschedule their appointment for today due to not completing their sleep study.Left message

## 2024-01-06 ENCOUNTER — Ambulatory Visit: Payer: MEDICAID

## 2024-01-06 DIAGNOSIS — R0681 Apnea, not elsewhere classified: Secondary | ICD-10-CM

## 2024-01-06 DIAGNOSIS — R0683 Snoring: Secondary | ICD-10-CM

## 2024-01-08 DIAGNOSIS — G4733 Obstructive sleep apnea (adult) (pediatric): Secondary | ICD-10-CM | POA: Diagnosis not present

## 2024-01-19 ENCOUNTER — Ambulatory Visit: Payer: Self-pay | Admitting: Nurse Practitioner

## 2024-01-19 NOTE — Progress Notes (Signed)
 Mild OSA. Will discuss at follow up.

## 2024-01-20 NOTE — Telephone Encounter (Signed)
 LVMTCB

## 2024-01-20 NOTE — Telephone Encounter (Signed)
-----   Message from Comer LULLA Rouleau sent at 01/19/2024 11:44 AM EDT ----- Mild OSA. Will discuss at follow up.  ----- Message ----- From: Render Sherlean SAILOR Sent: 01/14/2024  11:26 AM EDT To: Comer Rouleau LULLA, NP

## 2024-02-18 ENCOUNTER — Ambulatory Visit: Payer: MEDICAID | Admitting: Nurse Practitioner

## 2024-02-18 DIAGNOSIS — J441 Chronic obstructive pulmonary disease with (acute) exacerbation: Secondary | ICD-10-CM

## 2024-02-18 DIAGNOSIS — J209 Acute bronchitis, unspecified: Secondary | ICD-10-CM

## 2024-05-25 ENCOUNTER — Ambulatory Visit: Admitting: Nurse Practitioner
